# Patient Record
Sex: Male | Born: 1970
Health system: Southern US, Community
[De-identification: ages and names within clinical notes are randomized; demographics above are authoritative.]

## PROBLEM LIST (undated history)

## (undated) DIAGNOSIS — J301 Allergic rhinitis due to pollen: Secondary | ICD-10-CM

## (undated) DIAGNOSIS — K219 Gastro-esophageal reflux disease without esophagitis: Secondary | ICD-10-CM

## (undated) HISTORY — DX: Gastro-esophageal reflux disease without esophagitis: K21.9

## (undated) HISTORY — DX: Allergic rhinitis due to pollen: J30.1

## (undated) HISTORY — PX: NOSE SURGERY: SHX723

## (undated) HISTORY — PX: EAR CANALOPLASTY: SHX1481

---

## 2017-02-13 ENCOUNTER — Encounter: Payer: Self-pay | Admitting: Family Medicine

## 2017-02-13 ENCOUNTER — Ambulatory Visit (INDEPENDENT_AMBULATORY_CARE_PROVIDER_SITE_OTHER): Payer: 59 | Admitting: Family Medicine

## 2017-02-13 VITALS — BP 120/76 | HR 58 | Temp 98.6°F | Ht 71.0 in | Wt 204.4 lb

## 2017-02-13 DIAGNOSIS — E559 Vitamin D deficiency, unspecified: Secondary | ICD-10-CM | POA: Diagnosis not present

## 2017-02-13 DIAGNOSIS — Z0000 Encounter for general adult medical examination without abnormal findings: Secondary | ICD-10-CM | POA: Diagnosis not present

## 2017-02-13 DIAGNOSIS — R7989 Other specified abnormal findings of blood chemistry: Secondary | ICD-10-CM | POA: Diagnosis not present

## 2017-02-13 DIAGNOSIS — E785 Hyperlipidemia, unspecified: Secondary | ICD-10-CM

## 2017-02-13 DIAGNOSIS — R5383 Other fatigue: Secondary | ICD-10-CM | POA: Diagnosis not present

## 2017-02-13 DIAGNOSIS — J301 Allergic rhinitis due to pollen: Secondary | ICD-10-CM

## 2017-02-13 DIAGNOSIS — K219 Gastro-esophageal reflux disease without esophagitis: Secondary | ICD-10-CM

## 2017-02-13 DIAGNOSIS — R351 Nocturia: Secondary | ICD-10-CM | POA: Diagnosis not present

## 2017-02-13 HISTORY — DX: Gastro-esophageal reflux disease without esophagitis: K21.9

## 2017-02-13 HISTORY — DX: Allergic rhinitis due to pollen: J30.1

## 2017-02-13 LAB — COMPREHENSIVE METABOLIC PANEL
ALT: 21 U/L (ref 0–53)
AST: 23 U/L (ref 0–37)
Albumin: 4.3 g/dL (ref 3.5–5.2)
Alkaline Phosphatase: 31 U/L — ABNORMAL LOW (ref 39–117)
BUN: 11 mg/dL (ref 6–23)
CO2: 29 mEq/L (ref 19–32)
Calcium: 9.2 mg/dL (ref 8.4–10.5)
Chloride: 104 mEq/L (ref 96–112)
Creatinine, Ser: 1.15 mg/dL (ref 0.40–1.50)
GFR: 71.86 mL/min (ref >60.00)
Glucose, Bld: 87 mg/dL (ref 70–99)
Potassium: 3.9 mEq/L (ref 3.5–5.1)
Sodium: 141 mEq/L (ref 135–145)
Total Bilirubin: 0.8 mg/dL (ref 0.2–1.2)
Total Protein: 7.3 g/dL (ref 6.0–8.3)

## 2017-02-13 LAB — CBC WITH DIFFERENTIAL/PLATELET
Basophils Absolute: 0 10*3/uL (ref 0.0–0.1)
Basophils Relative: 0.5 % (ref 0.0–3.0)
Eosinophils Absolute: 0.2 10*3/uL (ref 0.0–0.7)
Eosinophils Relative: 5.5 % — ABNORMAL HIGH (ref 0.0–5.0)
HCT: 38.6 % — ABNORMAL LOW (ref 39.0–52.0)
Hemoglobin: 12.9 g/dL — ABNORMAL LOW (ref 13.0–17.0)
Lymphocytes Relative: 53.7 % — ABNORMAL HIGH (ref 12.0–46.0)
Lymphs Abs: 1.6 10*3/uL (ref 0.7–4.0)
MCHC: 33.4 g/dL (ref 30.0–36.0)
MCV: 85 fl (ref 78.0–100.0)
Monocytes Absolute: 0.3 10*3/uL (ref 0.1–1.0)
Monocytes Relative: 9 % (ref 3.0–12.0)
Neutro Abs: 0.9 10*3/uL — ABNORMAL LOW (ref 1.4–7.7)
Neutrophils Relative %: 31.3 % — ABNORMAL LOW (ref 43.0–77.0)
Platelets: 195 10*3/uL (ref 150.0–400.0)
RBC: 4.54 Mil/uL (ref 4.22–5.81)
RDW: 13 % (ref 11.5–15.5)
WBC: 3 10*3/uL — ABNORMAL LOW (ref 4.0–10.5)

## 2017-02-13 LAB — PSA: PSA: 0.36 ng/mL (ref 0.10–4.00)

## 2017-02-13 LAB — LIPID PANEL
Cholesterol: 122 mg/dL (ref 0–200)
HDL: 40.6 mg/dL (ref >39.00)
LDL Cholesterol: 72 mg/dL (ref 0–99)
NonHDL: 80.97
Total CHOL/HDL Ratio: 3
Triglycerides: 44 mg/dL (ref 0.0–149.0)
VLDL: 8.8 mg/dL (ref 0.0–40.0)

## 2017-02-13 LAB — TSH: TSH: 1.07 u[IU]/mL (ref 0.35–4.50)

## 2017-02-13 LAB — VITAMIN D 25 HYDROXY (VIT D DEFICIENCY, FRACTURES): VITD: 15.64 ng/mL — ABNORMAL LOW (ref 30.00–100.00)

## 2017-02-13 NOTE — Progress Notes (Signed)
Subjective:    Shawn Franco is a 47 y.o. male who presents today for his Complete Annual Exam.    Health Maintenance Due  Topic Date Due  . HIV Screening  03/14/1985   PMHx, SurgHx, SocialHx, Medications, and Allergies were reviewed in the Visit Navigator and updated as appropriate.   Past Medical History:  Diagnosis Date  . Gastroesophageal reflux disease without esophagitis 02/13/2017  . Seasonal allergic rhinitis due to pollen 02/13/2017   History reviewed. No pertinent surgical history. History reviewed. No pertinent family history.   Social History   Tobacco Use  . Smoking status: Never Smoker  . Smokeless tobacco: Never Used  Substance Use Topics  . Alcohol use: Not on file  . Drug use: Not on file   Review of Systems:   Pertinent items are noted in the HPI. Otherwise, ROS is negative.  Objective:   Vitals:   02/13/17 1405  BP: 120/76  Pulse: (!) 58  Temp: 98.6 F (37 C)  SpO2: 96%   Body mass index is 28.51 kg/m.  General Appearance:  Alert, cooperative, no distress, appears stated age  Head:  Normocephalic, without obvious abnormality, atraumatic  Eyes:  PERRL, conjunctiva/corneas clear, EOM's intact, fundi benign, both eyes       Ears:  Normal TM's and external ear canals, both ears  Nose: Nares normal, septum midline, mucosa normal, no drainage    or sinus tenderness  Throat: Lips, mucosa, and tongue normal; teeth and gums normal  Neck: Supple, symmetrical, trachea midline, no adenopathy; thyroid:  No enlargement/tenderness/nodules; no carotit bruit or JVD  Back:   Symmetric, no curvature, ROM normal, no CVA tenderness  Lungs:   Clear to auscultation bilaterally, respirations unlabored  Chest wall:  No tenderness or deformity  Heart:  Regular rate and rhythm, S1 and S2 normal, no murmur, rub   or gallop  Abdomen:   Soft, non-tender, bowel sounds active all four quadrants, no masses, no organomegaly  Extremities: Extremities normal, atraumatic, no  cyanosis or edema  Prostate: Not done.   Skin: Skin color, texture, turgor normal, no rashes or lesions  Lymph nodes: Cervical, supraclavicular, and axillary nodes normal  Neurologic: CNII-XII grossly intact. Normal strength, sensation and reflexes throughout   Assessment/Plan:   Shawn Franco was seen today for establish care.  Diagnoses and all orders for this visit:  Routine physical examination  Vitamin D deficiency -     VITAMIN D 25 Hydroxy (Vit-D Deficiency, Fractures)  Nocturia -     PSA  Fatigue, unspecified type -     Comprehensive metabolic panel -     CBC with Differential/Platelet -     TSH  Hyperlipidemia, unspecified hyperlipidemia type -     Lipid panel   Patient Counseling: [x]   Nutrition: Stressed importance of moderation in sodium/caffeine intake, saturated fat and cholesterol, caloric balance, sufficient intake of fresh fruits, vegetables, and fiber.  [x]   Stressed the importance of regular exercise.   []   Substance Abuse: Discussed cessation/primary prevention of tobacco, alcohol, or other drug use; driving or other dangerous activities under the influence; availability of treatment for abuse.   [x]   Injury prevention: Discussed safety belts, safety helmets, smoke detector, smoking near bedding or upholstery.   []   Sexuality: Discussed sexually transmitted diseases, partner selection, use of condoms, avoidance of unintended pregnancy and contraceptive alternatives.   [x]   Dental health: Discussed importance of regular tooth brushing, flossing, and dental visits.  [x]   Health maintenance and immunizations reviewed. Please refer  to Health maintenance section.   Shawn Deutscher, DO [name]

## 2017-02-15 DIAGNOSIS — E559 Vitamin D deficiency, unspecified: Secondary | ICD-10-CM | POA: Insufficient documentation

## 2017-02-15 MED ORDER — CHOLECALCIFEROL 1.25 MG (50000 UT) PO TABS: ORAL_TABLET | ORAL | 0 refills | Status: DC

## 2017-02-15 NOTE — Addendum Note (Signed)
Addended by: [name] R on: 02/15/2017 02:07 PM   Modules accepted: Orders

## 2017-04-06 ENCOUNTER — Telehealth: Payer: Self-pay | Admitting: Family Medicine

## 2017-04-06 NOTE — Telephone Encounter (Signed)
Called pt to discuss his symptoms for needing Valtrex. He states that he will break out in a cold sore and he takes this. He states it is a historical med. I told him that he might have to be seen in the office before he can get this. His last office visit was 02/13/17. His provider is  Hyman Hopes.  He states he takes 2 grams every day for Q12 hours when he is having the outbreak and he normally gets 30 tablets that will last him a year.  See request.  Pharmacy  [facility]  Please review.

## 2017-04-06 NOTE — Telephone Encounter (Signed)
Copied from [place] [id number]. Topic: Quick Communication - Rx Refill/Question >> Apr 06, 2017  3:14 PM Sandi Mariscal E, NT wrote: Medication: Valtrex 1000 mg Has the patient contacted their pharmacy? Yes (Agent: If no, request that the patient contact the pharmacy for the refill.) Preferred Pharmacy (with phone number or street name): [place], [place] - [address] [phone] (Phone) [phone] (Fax)     Agent: Please be advised that RX refills may take up to 3 business days. We ask that you follow-up with your pharmacy.

## 2017-04-06 NOTE — Telephone Encounter (Signed)
See note

## 2017-04-07 ENCOUNTER — Telehealth: Payer: 59 | Admitting: Family

## 2017-04-07 DIAGNOSIS — B009 Herpesviral infection, unspecified: Secondary | ICD-10-CM | POA: Diagnosis not present

## 2017-04-07 MED ORDER — VALACYCLOVIR HCL 1 G PO TABS: 2000.0000 mg | ORAL_TABLET | Freq: Two times a day (BID) | ORAL | 0 refills | Status: DC

## 2017-04-07 MED FILL — valACYclovir HCL 1 GM TABS: 1 | 7 days supply | Qty: 30 | Fill #0

## 2017-04-07 NOTE — Telephone Encounter (Signed)
Noted  

## 2017-04-07 NOTE — Telephone Encounter (Signed)
As this was not discussed at his visit, which was physical, he will need to be seen. Let him know that e-visit available to him.

## 2017-04-07 NOTE — Telephone Encounter (Signed)
Mychart message sent to patient.

## 2017-04-07 NOTE — Progress Notes (Signed)
Thank you for the details you included in the comment boxes. Those details are very helpful in determining the best course of treatment for you and help us to provide the best care.  We are sorry that you are not feeling well.  Here is how we plan to help!  Based on what you have shared with me it does look like you have a viral infection.    Most cold sores or fever blisters are small fluid filled blisters around the mouth caused by herpes simplex virus.  The most common strain of the virus causing cold sores is herpes simplex virus 1.  It can be spread by skin contact, sharing eating utensils, or even sharing towels.  Cold sores are contagious to other people until dry. (Approximately 5-7 days).  Wash your hands. You can spread the virus to your eyes through handling your contact lenses after touching the lesions.  Most people experience pain at the sight or tingling sensations in their lips that may begin before the ulcers erupt.  Herpes simplex is treatable but not curable.  It may lie dormant for a long time and then reappear due to stress or prolonged sun exposure.  Many patients have success in treating their cold sores with an over the counter topical called Abreva.  You may apply the cream up to 5 times daily (maximum 10 days) until healing occurs.  If you would like to use an oral antiviral medication to speed the healing of your cold sore, I have sent a prescription to your local pharmacy Valacyclovir 2 gm twice daily for 1 day    HOME CARE:   Wash your hands frequently.  Do not pick at or rub the sore.  Don't open the blisters.  Avoid kissing other people during this time.  Avoid sharing drinking glasses, eating utensils, or razors.  Do not handle contact lenses unless you have thoroughly washed your hands with soap and warm water!  Avoid oral sex during this time.  Herpes from sores on your mouth can spread to your partner's genital area.  Avoid contact with anyone who has  eczema or a weakened immune system.  Cold sores are often triggered by exposure to intense sunlight, use a lip balm containing a sunscreen (SPF 30 or higher).  GET HELP RIGHT AWAY IF:   Blisters look infected.  Blisters occur near or in the eye.  Symptoms last longer than 10 days.  Your symptoms become worse.  MAKE SURE YOU:   Understand these instructions.  Will watch your condition.  Will get help right away if you are not doing well or get worse.    Your e-visit answers were reviewed by a board certified advanced clinical practitioner to complete your personal care plan.  Depending upon the condition, your plan could have  Included both over the counter or prescription medications.    Please review your pharmacy choice.  Be sure that the pharmacy you have chosen is open so that you can pick up your prescription now.  If there is a problem you csn message your provider in MyChart to have the prescription routed to another pharmacy.    Your safety is important to us.  If you have drug allergies check our prescription carefully.  For the next 24 hours you can use MyChart to ask questions about today's visit, request a non-urgent call back, or ask for a work or school excuse from your e-visit provider.  You will get an email in the   the next two days asking about your experience.  I hope that your e-visit has been valuable and will speed your recovery.  

## 2017-04-07 NOTE — Telephone Encounter (Signed)
Pt called back and was advised of comments, pt is doing mychart visit

## 2017-04-07 NOTE — Telephone Encounter (Signed)
Pt said this needs to happen today bc he can not wait three business days. Please advise.

## 2017-04-07 NOTE — Telephone Encounter (Signed)
See Note

## 2017-04-07 NOTE — Telephone Encounter (Signed)
This was not addressed at appointment. Ok to give script?

## 2017-04-10 NOTE — Telephone Encounter (Signed)
Must be seen or e- visit

## 2018-06-10 ENCOUNTER — Encounter: Payer: Self-pay | Admitting: Family Medicine

## 2018-06-10 ENCOUNTER — Other Ambulatory Visit: Payer: Self-pay

## 2018-06-10 DIAGNOSIS — B009 Herpesviral infection, unspecified: Secondary | ICD-10-CM

## 2018-06-13 MED ORDER — VALACYCLOVIR HCL 1 G PO TABS: 2000.0000 mg | ORAL_TABLET | Freq: Two times a day (BID) | ORAL | 1 refills | Status: DC

## 2018-06-13 MED ORDER — METHOCARBAMOL 750 MG PO TABS: 750.0000 mg | ORAL_TABLET | Freq: Three times a day (TID) | ORAL | 1 refills | Status: DC | PRN

## 2018-06-13 MED ORDER — METHOCARBAMOL 750 MG PO TABS: 750.0000 mg | ORAL_TABLET | Freq: Every day | ORAL | 1 refills | Status: DC | PRN

## 2018-06-13 NOTE — Addendum Note (Signed)
Addended by: Jasper Loser on: 06/13/2018 11:43 AM   Modules accepted: Orders

## 2018-08-15 DIAGNOSIS — R5383 Other fatigue: Secondary | ICD-10-CM | POA: Diagnosis not present

## 2018-08-15 DIAGNOSIS — Z1159 Encounter for screening for other viral diseases: Secondary | ICD-10-CM | POA: Diagnosis not present

## 2018-08-15 DIAGNOSIS — Z0000 Encounter for general adult medical examination without abnormal findings: Secondary | ICD-10-CM | POA: Diagnosis not present

## 2019-01-18 DIAGNOSIS — Z860100 Personal history of colon polyps, unspecified: Secondary | ICD-10-CM

## 2019-01-18 HISTORY — DX: Personal history of colon polyps, unspecified: Z86.0100

## 2019-01-18 HISTORY — DX: Personal history of colonic polyps: Z86.010

## 2019-02-22 ENCOUNTER — Encounter: Payer: Self-pay | Admitting: Gastroenterology

## 2019-03-25 ENCOUNTER — Encounter: Payer: Self-pay | Admitting: Physician Assistant

## 2019-03-25 ENCOUNTER — Other Ambulatory Visit: Payer: Self-pay

## 2019-03-25 ENCOUNTER — Ambulatory Visit (INDEPENDENT_AMBULATORY_CARE_PROVIDER_SITE_OTHER): Payer: 59 | Admitting: Physician Assistant

## 2019-03-25 ENCOUNTER — Other Ambulatory Visit: Payer: Self-pay | Admitting: Physician Assistant

## 2019-03-25 VITALS — BP 110/72 | HR 61 | Ht 71.0 in | Wt 213.5 lb

## 2019-03-25 DIAGNOSIS — R351 Nocturia: Secondary | ICD-10-CM | POA: Diagnosis not present

## 2019-03-25 DIAGNOSIS — B009 Herpesviral infection, unspecified: Secondary | ICD-10-CM | POA: Diagnosis not present

## 2019-03-25 DIAGNOSIS — Z0000 Encounter for general adult medical examination without abnormal findings: Secondary | ICD-10-CM | POA: Diagnosis not present

## 2019-03-25 DIAGNOSIS — K219 Gastro-esophageal reflux disease without esophagitis: Secondary | ICD-10-CM

## 2019-03-25 DIAGNOSIS — Z125 Encounter for screening for malignant neoplasm of prostate: Secondary | ICD-10-CM

## 2019-03-25 DIAGNOSIS — R635 Abnormal weight gain: Secondary | ICD-10-CM | POA: Diagnosis not present

## 2019-03-25 DIAGNOSIS — E559 Vitamin D deficiency, unspecified: Secondary | ICD-10-CM

## 2019-03-25 DIAGNOSIS — E663 Overweight: Secondary | ICD-10-CM | POA: Diagnosis not present

## 2019-03-25 LAB — PSA: PSA: 0.36 ng/mL (ref 0.10–4.00)

## 2019-03-25 LAB — COMPREHENSIVE METABOLIC PANEL
ALT: 22 U/L (ref 0–53)
AST: 25 U/L (ref 0–37)
Albumin: 4.3 g/dL (ref 3.5–5.2)
Alkaline Phosphatase: 34 U/L — ABNORMAL LOW (ref 39–117)
BUN: 8 mg/dL (ref 6–23)
CO2: 30 mEq/L (ref 19–32)
Calcium: 9.6 mg/dL (ref 8.4–10.5)
Chloride: 104 mEq/L (ref 96–112)
Creatinine, Ser: 1.13 mg/dL (ref 0.40–1.50)
GFR: 68.41 mL/min (ref >60.00)
Glucose, Bld: 93 mg/dL (ref 70–99)
Potassium: 4.2 mEq/L (ref 3.5–5.1)
Sodium: 140 mEq/L (ref 135–145)
Total Bilirubin: 0.6 mg/dL (ref 0.2–1.2)
Total Protein: 7.5 g/dL (ref 6.0–8.3)

## 2019-03-25 LAB — CBC WITH DIFFERENTIAL/PLATELET
Basophils Absolute: 0 10*3/uL (ref 0.0–0.1)
Basophils Relative: 0.4 % (ref 0.0–3.0)
Eosinophils Absolute: 0.4 10*3/uL (ref 0.0–0.7)
Eosinophils Relative: 9.4 % — ABNORMAL HIGH (ref 0.0–5.0)
HCT: 38.7 % — ABNORMAL LOW (ref 39.0–52.0)
Hemoglobin: 13.1 g/dL (ref 13.0–17.0)
Lymphocytes Relative: 45.8 % (ref 12.0–46.0)
Lymphs Abs: 1.7 10*3/uL (ref 0.7–4.0)
MCHC: 33.7 g/dL (ref 30.0–36.0)
MCV: 85.1 fl (ref 78.0–100.0)
Monocytes Absolute: 0.4 10*3/uL (ref 0.1–1.0)
Monocytes Relative: 9.4 % (ref 3.0–12.0)
Neutro Abs: 1.3 10*3/uL — ABNORMAL LOW (ref 1.4–7.7)
Neutrophils Relative %: 35 % — ABNORMAL LOW (ref 43.0–77.0)
Platelets: 192 10*3/uL (ref 150.0–400.0)
RBC: 4.54 Mil/uL (ref 4.22–5.81)
RDW: 12.8 % (ref 11.5–15.5)
WBC: 3.8 10*3/uL — ABNORMAL LOW (ref 4.0–10.5)

## 2019-03-25 LAB — VITAMIN D 25 HYDROXY (VIT D DEFICIENCY, FRACTURES): VITD: 50.52 ng/mL (ref 30.00–100.00)

## 2019-03-25 LAB — TSH: TSH: 1.77 u[IU]/mL (ref 0.35–4.50)

## 2019-03-25 MED ORDER — TADALAFIL 20 MG PO TABS: 20.0000 mg | ORAL_TABLET | Freq: Every day | ORAL | 0 refills | Status: DC | PRN

## 2019-03-25 NOTE — Progress Notes (Deleted)
Shawn Franco is a 49 y.o. male is here to discuss: Transfer of care  I acted as a Education administrator for [employer], PA-C Anselmo Pickler, LPN  History of Present Illness:   No chief complaint on file.   HPI  Pt is here today for transfer of care from Dr. Juleen Franco.    Health Maintenance Due  Topic Date Due  . HIV Screening  03/14/1985  . COLONOSCOPY  03/13/2018  . INFLUENZA VACCINE  08/18/2018    Past Medical History:  Diagnosis Date  . Gastroesophageal reflux disease without esophagitis 02/13/2017  . Seasonal allergic rhinitis due to pollen 02/13/2017     Social History   Socioeconomic History  . Marital status: Not on file    Spouse name: Not on file  . Number of children: Not on file  . Years of education: Not on file  . Highest education level: Not on file  Occupational History  . Not on file  Tobacco Use  . Smoking status: Never Smoker  . Smokeless tobacco: Never Used  Substance and Sexual Activity  . Alcohol use: Not on file  . Drug use: Not on file  . Sexual activity: Not on file  Other Topics Concern  . Not on file  Social History Narrative  . Not on file   Social Determinants of Health   Financial Resource Strain:   . Difficulty of Paying Living Expenses: Not on file  Food Insecurity:   . Worried About Charity fundraiser in the Last Year: Not on file  . Ran Out of Food in the Last Year: Not on file  Transportation Needs:   . Lack of Transportation (Medical): Not on file  . Lack of Transportation (Non-Medical): Not on file  Physical Activity:   . Days of Exercise per Week: Not on file  . Minutes of Exercise per Session: Not on file  Stress:   . Feeling of Stress : Not on file  Social Connections:   . Frequency of Communication with Friends and Family: Not on file  . Frequency of Social Gatherings with Friends and Family: Not on file  . Attends Religious Services: Not on file  . Active Member of Clubs or Organizations: Not on file  .  Attends Archivist Meetings: Not on file  . Marital Status: Not on file  Intimate Partner Violence:   . Fear of Current or Ex-Partner: Not on file  . Emotionally Abused: Not on file  . Physically Abused: Not on file  . Sexually Abused: Not on file    No past surgical history on file.  No family history on file.  PMHx, SurgHx, SocialHx, FamHx, Medications, and Allergies were reviewed in the Visit Navigator and updated as appropriate.   Patient Active Problem List   Diagnosis Date Noted  . Vitamin D deficiency 02/15/2017  . Seasonal allergic rhinitis due to pollen 02/13/2017  . Gastroesophageal reflux disease without esophagitis 02/13/2017    Social History   Tobacco Use  . Smoking status: Never Smoker  . Smokeless tobacco: Never Used  Substance Use Topics  . Alcohol use: Not on file  . Drug use: Not on file    Current Medications and Allergies:    Current Outpatient Medications:  .  cetirizine (ZYRTEC) 10 MG tablet, Take 10 mg by mouth., Disp: , Rfl:  .  Cholecalciferol 50000 units TABS, 50,000 units PO qwk for 12 weeks., Disp: 12 tablet, Rfl: 0 .  fluticasone (FLONASE) 50 MCG/ACT nasal  spray, 1 spray by Each Nare route daily., Disp: , Rfl:  .  Ibuprofen 200 MG CAPS, Take by mouth., Disp: , Rfl:  .  methocarbamol (ROBAXIN) 750 MG tablet, Take 1 tablet (750 mg total) by mouth every 8 (eight) hours as needed for muscle spasms., Disp: 90 tablet, Rfl: 1 .  ranitidine (ZANTAC) 75 MG tablet, Take 75 mg by mouth., Disp: , Rfl:  .  valACYclovir (VALTREX) 1000 MG tablet, Take 2 tablets (2,000 mg total) by mouth 2 (two) times daily. x1 day and may repeat with future outbreaks, Disp: 30 tablet, Rfl: 1  Allergies  Allergen Reactions  . Procaine Anaphylaxis  . Tetanus Toxoids Anaphylaxis    Tetanus Serum    Review of Systems   ROS  Vitals:  There were no vitals filed for this visit.   There is no height or weight on file to calculate BMI.   Physical Exam:     Physical Exam   Assessment and Plan:    There are no diagnoses linked to this encounter.  . Reviewed expectations re: course of current medical issues. . Discussed self-management of symptoms. . Outlined signs and symptoms indicating need for more acute intervention. . Patient verbalized understanding and all questions were answered. . See orders for this visit as documented in the electronic medical record. . Patient received an After Visit Summary.  ***  Inda Coke, PA-C [place] [place] 03/25/2019  Follow-up: No follow-ups on file.

## 2019-03-25 NOTE — Patient Instructions (Signed)

## 2019-03-25 NOTE — Progress Notes (Signed)
I acted as a Education administrator for [employer], [employer] I acted as a Education administrator for [employer], PA-C Anselmo Pickler, LPN  Subjective:    Shawn Franco is a 49 y.o. male and is here for a comprehensive physical exam.  HPI  Pt is here for transfer of care today from Dr. Juleen China and physical.  Health Maintenance Due  Topic Date Due  . HIV Screening  03/14/1985  . COLONOSCOPY  03/13/2018    Acute Concerns: Nocturia -- about two times per week has issues with waking up in the middle of the night to urinate which causes him to lose about an hour (or more) of sleep due to difficulty falling asleep after this occurs.  GERD -- has had GERD since he was a teenager; taking emeprazole prn; GERD; seeing Dr. Dorena Bodo at end of March for screening colonoscopy; since a teenager has had GERD issues; no red flag symptoms  Chronic Issues: Vit D deficiency -- doesn't take a regular supplement; would like Vit D levels rechecked HSV -- gets outbreaks with stress; always on the R side of his mouth, happens 3-4 times per year  Health Maintenance: Immunizations -- UTD Colonoscopy -- scheduled March 31 [facility] -- done 2019 normal Diet -- eating less Caffeine intake -- 3 cups a day during weekly schedule; 1 cup a day during Sleep habits -- waking up to urinate about 2 x a week Exercise -- no scheduled exercise; 5k Weight -- Weight: 213 lb 8 oz (96.8 kg)  Weight history Wt Readings from Last 10 Encounters:  03/25/19 213 lb 8 oz (96.8 kg)  02/13/17 204 lb 6.4 oz (92.7 kg)  Mood -- no concerns Tobacco use -- none Alcohol use --- none  Depression screen PHQ 2/9 03/25/2019  Decreased Interest 0  Down, Depressed, Hopeless 0  PHQ - 2 Score 0     Other providers/specialists: Patient Care Team: Inda Coke, [place] as PCP - General (Physician Assistant)   PMHx, SurgHx, SocialHx, Medications, and Allergies were reviewed in the Visit Navigator and updated as appropriate.   Past  Medical History:  Diagnosis Date  . Gastroesophageal reflux disease without esophagitis 02/13/2017  . Seasonal allergic rhinitis due to pollen 02/13/2017    History reviewed. No pertinent surgical history.   Family History  Problem Relation Age of Onset  . Diabetes Mother   . Hypertension Mother     Social History   Tobacco Use  . Smoking status: Never Smoker  . Smokeless tobacco: Never Used  Substance Use Topics  . Alcohol use: Not on file  . Drug use: Not on file    Review of Systems:   Review of Systems  Constitutional: Negative for chills, fever, malaise/fatigue and weight loss.  HENT: Negative for hearing loss, sinus pain and sore throat.   Respiratory: Negative for cough and hemoptysis.   Cardiovascular: Negative for chest pain, palpitations, leg swelling and PND.  Gastrointestinal: Negative for abdominal pain, constipation, diarrhea, heartburn, nausea and vomiting.  Genitourinary: Negative for dysuria, frequency and urgency.  Musculoskeletal: Negative for back pain, myalgias and neck pain.  Skin: Negative for itching and rash.  Neurological: Negative for dizziness, tingling, seizures and headaches.  Endo/Heme/Allergies: Negative for polydipsia.  Psychiatric/Behavioral: Negative for depression. The patient is not nervous/anxious.     Objective:   Vitals:   03/25/19 0841  BP: 110/72  Pulse: 61  SpO2: 98%   Body mass index is 29.78 kg/m.  General Appearance:  Alert, cooperative, no  distress, appears stated age  Head:  Normocephalic, without obvious abnormality, atraumatic  Eyes:  PERRL, conjunctiva/corneas clear, EOM's intact, fundi benign, both eyes       Ears:  Normal TM's and external ear canals, both ears  Nose: Nares normal, septum midline, mucosa normal, no drainage    or sinus tenderness  Throat: Lips, mucosa, and tongue normal; teeth and gums normal  Neck: Supple, symmetrical, trachea midline, no adenopathy; thyroid:  No  enlargement/tenderness/nodules; no carotit bruit or JVD  Back:   Symmetric, no curvature, ROM normal, no CVA tenderness  Lungs:   Clear to auscultation bilaterally, respirations unlabored  Chest wall:  No tenderness or deformity  Heart:  Regular rate and rhythm, S1 and S2 normal, no murmur, rub   or gallop  Abdomen:   Soft, non-tender, bowel sounds active all four quadrants, no masses, no organomegaly  Extremities: Extremities normal, atraumatic, no cyanosis or edema  Prostate: Not done.   Skin: Skin color, texture, turgor normal, no rashes or lesions  Lymph nodes: Cervical, supraclavicular, and axillary nodes normal  Neurologic: CNII-XII grossly intact. Normal strength, sensation and reflexes throughout    Assessment/Plan:   Kody was seen today for transfer of care and annual exam.  Diagnoses and all orders for this visit:  Routine physical examination Today patient counseled on age appropriate routine health concerns for screening and prevention, each reviewed and up to date or declined. Immunizations reviewed and up to date or declined. Labs ordered and reviewed. Risk factors for depression reviewed and negative. Hearing function and visual acuity are intact. ADLs screened and addressed as needed. Functional ability and level of safety reviewed and appropriate. Education, counseling and referrals performed based on assessed risks today. Patient provided with a copy of personalized plan for preventive services.  Weight gain; Overweight Suspect due to pandemic causing significantly decreased activity. Encouraged exercise. Will check TSH to r/o organic cause. -     TSH  Vitamin D deficiency -     VITAMIN D 25 Hydroxy (Vit-D Deficiency, Fractures)  Gastroesophageal reflux disease without esophagitis Referral to GI per patient request. -     CBC with Differential/Platelet -     Comprehensive metabolic panel -     Ambulatory referral to Gastroenterology  Prostate cancer screening  -     PSA  Nocturia He would like to trial prn cialis for this. Follow-up if symptoms worsen or persist despite treatment. -     PSA  HSV-1 (herpes simplex virus 1) infection Well controlled.   Other orders -     tadalafil (CIALIS) 20 MG tablet; Take 1 tablet (20 mg total) by mouth daily as needed (BPH).   Well Adult Exam: Labs ordered: Yes. Patient counseling was done. See below for items discussed. Discussed the patient's BMI.  The BMI is not in the acceptable range; BMI management plan is completed Follow up as needed for acute illness.  Patient Counseling: [x]   Nutrition: Stressed importance of moderation in sodium/caffeine intake, saturated fat and cholesterol, caloric balance, sufficient intake of fresh fruits, vegetables, and fiber.  [x]   Stressed the importance of regular exercise.   []   Substance Abuse: Discussed cessation/primary prevention of tobacco, alcohol, or other drug use; driving or other dangerous activities under the influence; availability of treatment for abuse.   [x]   Injury prevention: Discussed safety belts, safety helmets, smoke detector, smoking near bedding or upholstery.   []   Sexuality: Discussed sexually transmitted diseases, partner selection, use of condoms, avoidance of unintended pregnancy  and contraceptive alternatives.   [x]   Dental health: Discussed importance of regular tooth brushing, flossing, and dental visits.  [x]   Health maintenance and immunizations reviewed. Please refer to Health maintenance section.    CMA or LPN served as scribe during this visit. History, Physical, and Plan performed by medical provider. The above documentation has been reviewed and is accurate and complete.   Inda Coke, PA-C [place]

## 2019-03-26 ENCOUNTER — Encounter: Payer: Self-pay | Admitting: Gastroenterology

## 2019-03-26 ENCOUNTER — Ambulatory Visit (INDEPENDENT_AMBULATORY_CARE_PROVIDER_SITE_OTHER): Payer: 59 | Admitting: Gastroenterology

## 2019-03-26 VITALS — BP 102/70 | HR 76 | Temp 97.6°F | Ht 71.0 in | Wt 212.0 lb

## 2019-03-26 DIAGNOSIS — K219 Gastro-esophageal reflux disease without esophagitis: Secondary | ICD-10-CM

## 2019-03-26 DIAGNOSIS — Z1211 Encounter for screening for malignant neoplasm of colon: Secondary | ICD-10-CM | POA: Insufficient documentation

## 2019-03-26 MED ORDER — NA SULFATE-K SULFATE-MG SULF 17.5-3.13-1.6 GM/177ML PO SOLN: 1.0000 | Freq: Once | ORAL | 0 refills | Status: AC

## 2019-03-26 NOTE — Patient Instructions (Signed)
If you are age 51 or older, your body mass index should be between 23-30. Your Body mass index is 29.57 kg/m. If this is out of the aforementioned range listed, please consider follow up with your Primary Care Provider.  If you are age 68 or younger, your body mass index should be between 19-25. Your Body mass index is 29.57 kg/m. If this is out of the aformentioned range listed, please consider follow up with your Primary Care Provider.   You have been scheduled for an endoscopy and colonoscopy. Please follow the written instructions given to you at your visit today. Please pick up your prep supplies at the pharmacy within the next 1-3 days. If you use inhalers (even only as needed), please bring them with you on the day of your procedure.

## 2019-03-26 NOTE — Progress Notes (Signed)
03/26/2019 [facility] [id number] November 26, 1970   HISTORY OF PRESENT ILLNESS: This is a 49 year old male who is new to our office.  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  He is here today to discuss having an endoscopy.  He was going to be scheduled for screening colonoscopy as he never had one in the past, but then he wanted to discuss having endoscopy as well due to longstanding issues with acid reflux.  He has had issues with acid reflux on and off for at least the past 30 years.  He really only uses omeprazole as needed at this time.  Says he really only takes it if he knows that he is going to eat something that will seem to bother his stomach and cause acid reflux issues.  Otherwise he has no complaints.  Referred here by his PCP, Shawn Coke, PA-C.  Past Medical History:  Diagnosis Date  . Gastroesophageal reflux disease without esophagitis 02/13/2017  . Seasonal allergic rhinitis due to pollen 02/13/2017   No past surgical history on file.  reports that he has never smoked. He has never used smokeless tobacco. He reports that he does not drink alcohol or use drugs. family history includes Diabetes in his mother; Hypertension in his mother. Allergies  Allergen Reactions  . Procaine Anaphylaxis  . Tetanus Toxoids Anaphylaxis    Tetanus Serum      Outpatient Encounter Medications as of 03/26/2019  Medication Sig  . cetirizine (ZYRTEC) 10 MG tablet Take 10 mg by mouth.  . fluticasone (FLONASE) 50 MCG/ACT nasal spray 1 spray by Each Nare route daily.  . Ibuprofen 200 MG CAPS Take by mouth.  . methocarbamol (ROBAXIN) 750 MG tablet Take 1 tablet (750 mg total) by mouth every 8 (eight) hours as needed for muscle spasms.  . Omeprazole 20 MG TBEC Take by mouth as needed.  . ranitidine (ZANTAC) 75 MG tablet Take 75 mg by mouth.  . tadalafil (CIALIS) 20 MG tablet Take 1 tablet (20 mg total) by mouth daily as needed (BPH).  .  valACYclovir (VALTREX) 1000 MG tablet Take 2 tablets (2,000 mg total) by mouth 2 (two) times daily. x1 day and may repeat with future outbreaks   No facility-administered encounter medications on file as of 03/26/2019.    REVIEW OF SYSTEMS  : All other systems reviewed and negative except where noted in the History of Present Illness.   PHYSICAL EXAM: BP 102/70   Pulse 76   Temp 97.6 F (36.4 C)   Ht 5\' 11"  (1.803 m)   Wt 212 lb (96.2 kg)   BMI 29.57 kg/m  General: Well developed AA male in no acute distress Head: Normocephalic and atraumatic Eyes:  Sclerae anicteric, conjunctiva pink. Ears: Normal auditory acuity Lungs: Clear throughout to auscultation; no increased WOB. Heart: Regular rate and rhythm; no M/R/G. Abdomen: Soft, non-distended.  BS present.  Non-tender. Rectal:  Will be done at the time of colonoscopy. Musculoskeletal: Symmetrical with no gross deformities  Skin: No lesions on visible extremities Extremities: No edema  Neurological: Alert oriented x 4, grossly non-focal Psychological:  Alert and cooperative. Normal mood and affect  ASSESSMENT AND PLAN: *CRC screening:  Never had colonoscopy in the past.  No complaints.  Will schedule with Dr. Rush Landmark. *GERD:  Long-standing GERD.  Just uses omeprazole prn.  Has never had an EGD in the past and just wants to take a look to be sure that his long-standing  GERD has not caused any issues.  Will plan for EGD as well.  **The risks, benefits, and alternatives to EGD and colonoscopy were discussed with the patient and he consents to proceed.   CC:  [facility], [place]

## 2019-03-27 NOTE — Progress Notes (Signed)
Attending Physician's Attestation   I have reviewed the chart.   I agree with the Advanced Practitioner's note, impression, and recommendations with any updates as below.  Colonoscopy for screening and EGD diagnostic to rule out Esophagitis/Barrett's is next step.  Justice Britain, MD [facility] Advanced [facility]oscopy Office # [phone]

## 2019-04-17 ENCOUNTER — Encounter: Payer: 59 | Admitting: Gastroenterology

## 2019-04-18 DIAGNOSIS — A048 Other specified bacterial intestinal infections: Secondary | ICD-10-CM

## 2019-04-18 DIAGNOSIS — K3189 Other diseases of stomach and duodenum: Secondary | ICD-10-CM

## 2019-04-18 HISTORY — DX: Other specified bacterial intestinal infections: A04.8

## 2019-04-18 HISTORY — DX: Other diseases of stomach and duodenum: K31.89

## 2019-05-16 ENCOUNTER — Encounter: Payer: Self-pay | Admitting: Gastroenterology

## 2019-05-16 ENCOUNTER — Ambulatory Visit (AMBULATORY_SURGERY_CENTER): Payer: 59 | Admitting: Gastroenterology

## 2019-05-16 ENCOUNTER — Other Ambulatory Visit: Payer: Self-pay

## 2019-05-16 VITALS — BP 126/75 | HR 64 | Temp 97.3°F | Resp 16 | Ht 71.0 in | Wt 212.0 lb

## 2019-05-16 DIAGNOSIS — Z1211 Encounter for screening for malignant neoplasm of colon: Secondary | ICD-10-CM

## 2019-05-16 DIAGNOSIS — D123 Benign neoplasm of transverse colon: Secondary | ICD-10-CM | POA: Diagnosis not present

## 2019-05-16 DIAGNOSIS — D122 Benign neoplasm of ascending colon: Secondary | ICD-10-CM

## 2019-05-16 DIAGNOSIS — B9681 Helicobacter pylori [H. pylori] as the cause of diseases classified elsewhere: Secondary | ICD-10-CM

## 2019-05-16 DIAGNOSIS — K2970 Gastritis, unspecified, without bleeding: Secondary | ICD-10-CM | POA: Diagnosis not present

## 2019-05-16 DIAGNOSIS — K2950 Unspecified chronic gastritis without bleeding: Secondary | ICD-10-CM | POA: Diagnosis not present

## 2019-05-16 DIAGNOSIS — K3189 Other diseases of stomach and duodenum: Secondary | ICD-10-CM

## 2019-05-16 DIAGNOSIS — K219 Gastro-esophageal reflux disease without esophagitis: Secondary | ICD-10-CM | POA: Diagnosis not present

## 2019-05-16 MED ORDER — SODIUM CHLORIDE 0.9 % IV SOLN: 500.0000 mL | INTRAVENOUS | Status: DC

## 2019-05-16 NOTE — Op Note (Signed)
[facility] Patient Name: [facility] Procedure Date: 05/16/2019 11:06 AM MRN: [id number] Endoscopist: Justice Britain , MD Age: 48 Referring MD:  Date of Birth: Oct 14, 1970 Gender: Male Account #: [id number] Procedure:                Colonoscopy Indications:              Screening for colorectal malignant neoplasm, This                            is the patient's first colonoscopy Medicines:                Monitored Anesthesia Care Procedure:                Pre-Anesthesia Assessment:                           - Prior to the procedure, a History and Physical                            was performed, and patient medications and                            allergies were reviewed. The patient's tolerance of                            previous anesthesia was also reviewed. The risks                            and benefits of the procedure and the sedation                            options and risks were discussed with the patient.                            All questions were answered, and informed consent                            was obtained. Prior Anticoagulants: The patient has                            taken no previous anticoagulant or antiplatelet                            agents. ASA Grade Assessment: II - A patient with                            mild systemic disease. After reviewing the risks                            and benefits, the patient was deemed in                            satisfactory condition to undergo the procedure.  After obtaining informed consent, the colonoscope                            was passed under direct vision. Throughout the                            procedure, the patient's blood pressure, pulse, and                            oxygen saturations were monitored continuously. The                            Colonoscope was introduced through the anus and                            advanced to the 5 cm  into the ileum. The                            colonoscopy was performed without difficulty. The                            patient tolerated the procedure. The quality of the                            bowel preparation was adequate. The terminal ileum,                            ileocecal valve, appendiceal orifice, and rectum                            were photographed. Scope In: 11:23:17 AM Scope Out: 11:41:07 AM Scope Withdrawal Time: 0 hours 15 minutes 5 seconds  Total Procedure Duration: 0 hours 17 minutes 50 seconds  Findings:                 The digital rectal exam findings include                            hemorrhoids. Pertinent negatives include no                            palpable rectal lesions.                           The terminal ileum and ileocecal valve appeared                            normal.                           A large amount of liquid semi-liquid stool was                            found in the entire colon, interfering with  visualization. Lavage of the area was performed                            using copious amounts, resulting in clearance with                            adequate visualization.                           Two sessile polyps were found in the transverse                            colon (1) and ascending colon (1). The polyps were                            4 to 5 mm in size. These polyps were removed with a                            cold snare. Resection and retrieval were complete.                           Normal mucosa was found in the entire colon                            otherwise.                           Non-bleeding non-thrombosed internal hemorrhoids                            were found during retroflexion, during perianal                            exam and during digital exam. The hemorrhoids were                            Grade II (internal hemorrhoids that prolapse but                             reduce spontaneously). Complications:            No immediate complications. Estimated Blood Loss:     Estimated blood loss was minimal. Impression:               - Hemorrhoids found on digital rectal exam.                           - The examined portion of the ileum was normal.                           - Stool in the entire examined colon. Lavaged                            copiously with adequate visualization.                           -  Two 4 to 5 mm polyps in the transverse colon and                            in the ascending colon, removed with a cold snare.                            Resected and retrieved.                           - Normal mucosa in the entire examined colon.                           - Non-bleeding non-thrombosed internal hemorrhoids. Recommendation:           - The patient will be observed post-procedure,                            until all discharge criteria are met.                           - Discharge patient to home.                           - Patient has a contact number available for                            emergencies. The signs and symptoms of potential                            delayed complications were discussed with the                            patient. Return to normal activities tomorrow.                            Written discharge instructions were provided to the                            patient.                           - High fiber diet.                           - Use fiber, for example Citrucel, Fibercon, Konsyl                            or Metamucil.                           - Continue present medications.                           - Await pathology results.                           -  Repeat colonoscopy in 5 v 7 years for                            surveillance based on final pathology.                           - The findings and recommendations were discussed                            with the patient. Justice Britain, MD 05/16/2019 11:53:24 AM

## 2019-05-16 NOTE — Progress Notes (Signed)
Called to room to assist during endoscopic procedure.  Patient ID and intended procedure confirmed with present staff. Received instructions for my participation in the procedure from the performing physician.  

## 2019-05-16 NOTE — Patient Instructions (Signed)
YOU HAD AN ENDOSCOPIC PROCEDURE TODAY AT [facility]:   Refer to the procedure report that was given to you for any specific questions about what was found during the examination.  If the procedure report does not answer your questions, please call your gastroenterologist to clarify.  If you requested that your care partner not be given the details of your procedure findings, then the procedure report has been included in a sealed envelope for you to review at your convenience later.  **Handouts given on Gastritis, polyps, High fiber diet, and hemorrhoids**   YOU SHOULD EXPECT: Some feelings of bloating in the abdomen. Passage of more gas than usual.  Walking can help get rid of the air that was put into your GI tract during the procedure and reduce the bloating. If you had a lower endoscopy (such as a colonoscopy or flexible sigmoidoscopy) you may notice spotting of blood in your stool or on the toilet paper. If you underwent a bowel prep for your procedure, you may not have a normal bowel movement for a few days.  Please Note:  You might notice some irritation and congestion in your nose or some drainage.  This is from the oxygen used during your procedure.  There is no need for concern and it should clear up in a day or so.  SYMPTOMS TO REPORT IMMEDIATELY:   Following lower endoscopy (colonoscopy or flexible sigmoidoscopy):  Excessive amounts of blood in the stool  Significant tenderness or worsening of abdominal pains  Swelling of the abdomen that is new, acute  Fever of 100F or higher   Following upper endoscopy (EGD)  Vomiting of blood or coffee ground material  New chest pain or pain under the shoulder blades  Painful or persistently difficult swallowing  New shortness of breath  Fever of 100F or higher  Black, tarry-looking stools  For urgent or emergent issues, a gastroenterologist can be reached at any hour by calling [phone]. Do not use MyChart  messaging for urgent concerns.    DIET:  We do recommend a small meal at first, but then you may proceed to your regular diet.  Drink plenty of fluids but you should avoid alcoholic beverages for 24 hours.  ACTIVITY:  You should plan to take it easy for the rest of today and you should NOT DRIVE or use heavy machinery until tomorrow (because of the sedation medicines used during the test).    FOLLOW UP: Our staff will call the number listed on your records 48-72 hours following your procedure to check on you and address any questions or concerns that you may have regarding the information given to you following your procedure. If we do not reach you, we will leave a message.  We will attempt to reach you two times.  During this call, we will ask if you have developed any symptoms of COVID 19. If you develop any symptoms (ie: fever, flu-like symptoms, shortness of breath, cough etc.) before then, please call [phone].  If you test positive for Covid 19 in the 2 weeks post procedure, please call and report this information to [place].    If any biopsies were taken you will be contacted by phone or by letter within the next 1-3 weeks.  Please call us at [phone] if you have not heard about the biopsies in 3 weeks.    SIGNATURES/CONFIDENTIALITY: You and/or your care partner have signed paperwork which will be entered into your electronic medical record.  These signatures attest to the fact that that the information above on your After Visit Summary has been reviewed and is understood.  Full responsibility of the confidentiality of this discharge information lies with you and/or your care-partner.

## 2019-05-16 NOTE — Progress Notes (Signed)
Temp JB V/s CW  

## 2019-05-16 NOTE — Progress Notes (Signed)
A/ox3, pleased with MAC, report to RN 

## 2019-05-16 NOTE — Op Note (Signed)
[facility] Patient Name: Nouri Brusky Procedure Date: 05/16/2019 11:07 AM MRN: [id number] Endoscopist: Justice Britain , MD Age: 49 Referring MD:  Date of Birth: 10/15/1970 Gender: Male Account #: [id number] Procedure:                Upper GI endoscopy Indications:              Heartburn, Gastro-esophageal reflux disease,                            Follow-up of gastro-esophageal reflux disease Medicines:                Monitored Anesthesia Care Procedure:                Pre-Anesthesia Assessment:                           - Prior to the procedure, a History and Physical                            was performed, and patient medications and                            allergies were reviewed. The patient's tolerance of                            previous anesthesia was also reviewed. The risks                            and benefits of the procedure and the sedation                            options and risks were discussed with the patient.                            All questions were answered, and informed consent                            was obtained. Prior Anticoagulants: The patient has                            taken no previous anticoagulant or antiplatelet                            agents. ASA Grade Assessment: II - A patient with                            mild systemic disease. After reviewing the risks                            and benefits, the patient was deemed in                            satisfactory condition to undergo the procedure.  After obtaining informed consent, the endoscope was                            passed under direct vision. Throughout the                            procedure, the patient's blood pressure, pulse, and                            oxygen saturations were monitored continuously. The                            Endoscope was introduced through the mouth, and                            advanced to  the second part of duodenum. The upper                            GI endoscopy was accomplished without difficulty.                            The patient tolerated the procedure. Scope In: Scope Out: Findings:                 No gross lesions were noted in the entire                            esophagus. Biopsies were taken with a cold forceps                            for histology.                           The Z-line was regular and was found 40 cm from the                            incisors.                           Patchy moderate inflammation characterized by                            erosions, erythema and granularity was found in the                            gastric body, at the incisura and in the gastric                            antrum. Biopsies were taken with a cold forceps for                            histology and Helicobacter pylori testing.                           No other gross  lesions were noted in the entire                            examined stomach.                           A single 15 mm submucosal nodule was found in the                            second portion of the duodenum. It had a positive                            pillow sign suggestive of potential Lipoma.                            Tunneled biopsies were taken with a cold forceps                            for histology.                           No other gross lesions were noted in the duodenal                            bulb, in the first portion of the duodenum and in                            the second portion of the duodenum. Complications:            No immediate complications. Estimated Blood Loss:     Estimated blood loss was minimal. Impression:               - No gross lesions in esophagus. Biopsied. Z-line                            regular, 40 cm from the incisors.                           - Gastritis. Biopsied.                           - Submucosal nodule found in the  duodenum with                            positive pillow sign suggestive of lipoma. Tunnel                            biopsied.                           - No other gross lesions in the duodenal bulb, in                            the first portion of the duodenum and in the second  portion of the duodenum. Recommendation:           - Proceed to scheduled colonoscopy.                           - Await pathology results.                           - Observe patient's clinical course.                           - Recommend Omeprazole 20 mg daily for next 2-weeks                            at minimum.                           - If patient is found to have chronic gastritis or                            Intestinal metaplasia then would recommend repeat                            EGD and PPI therapy to be continuous for a period                            in time to be determined.                           - Consider possible CT-Abdomen +/- EUS based on                            further discussion with patient and finalization of                            results.                           - The findings and recommendations were discussed                            with the patient. Justice Britain, MD 05/16/2019 11:49:15 AM

## 2019-05-20 ENCOUNTER — Telehealth: Payer: Self-pay | Admitting: *Deleted

## 2019-05-20 ENCOUNTER — Encounter: Payer: Self-pay | Admitting: Gastroenterology

## 2019-05-20 ENCOUNTER — Telehealth: Payer: Self-pay

## 2019-05-20 NOTE — Telephone Encounter (Signed)
  Follow up Call-  Call back number 05/16/2019  Post procedure Call Back phone  # [phone]  Permission to leave phone message Yes     Patient questions:  Do you have a fever, pain , or abdominal swelling? No. Pain Score  0 *  Have you tolerated food without any problems? Yes.    Have you been able to return to your normal activities? Yes.    Do you have any questions about your discharge instructions: Diet   No. Medications  No. Follow up visit  No.  Do you have questions or concerns about your Care? No.  Actions: * If pain score is 4 or above: No action needed, pain <4. 1. Have you developed a fever since your procedure? no  2.   Have you had an respiratory symptoms (SOB or cough) since your procedure? no  3.   Have you tested positive for COVID 19 since your procedure no  4.   Have you had any family members/close contacts diagnosed with the COVID 19 since your procedure?  no   If yes to any of these questions please route to Joylene John, RN and Erenest Rasher, RN

## 2019-05-20 NOTE — Telephone Encounter (Signed)
  Follow up Call-  Call back number 05/16/2019  Post procedure Call Back phone  # [phone]  Permission to leave phone message Yes     Patient questions:  No message left.  Unable to reach patient.

## 2019-05-21 ENCOUNTER — Other Ambulatory Visit: Payer: Self-pay

## 2019-05-21 ENCOUNTER — Telehealth: Payer: Self-pay | Admitting: Gastroenterology

## 2019-05-21 DIAGNOSIS — A048 Other specified bacterial intestinal infections: Secondary | ICD-10-CM

## 2019-05-21 DIAGNOSIS — K3189 Other diseases of stomach and duodenum: Secondary | ICD-10-CM

## 2019-05-21 MED ORDER — AMOXICILLIN 500 MG PO TABS: 1000.0000 mg | ORAL_TABLET | Freq: Two times a day (BID) | ORAL | 0 refills | Status: AC

## 2019-05-21 MED ORDER — OMEPRAZOLE 20 MG PO CPDR: 20.0000 mg | DELAYED_RELEASE_CAPSULE | Freq: Two times a day (BID) | ORAL | 0 refills | Status: DC

## 2019-05-21 MED ORDER — PYLERA 140-125-125 MG PO CAPS
3.0000 | ORAL_CAPSULE | Freq: Three times a day (TID) | ORAL | 0 refills | Status: DC
Start: 2019-05-21 — End: 2019-05-21

## 2019-05-21 MED ORDER — CLARITHROMYCIN 500 MG PO TABS: 500.0000 mg | ORAL_TABLET | Freq: Two times a day (BID) | ORAL | 0 refills | Status: AC

## 2019-05-21 NOTE — Telephone Encounter (Signed)
Patient called to advise the medication sent today (Pylera) is too expensive would like generic sent

## 2019-05-21 NOTE — Telephone Encounter (Signed)
[facility] has already taking care of this.

## 2019-06-11 ENCOUNTER — Other Ambulatory Visit (HOSPITAL_COMMUNITY): Payer: 59

## 2019-06-14 ENCOUNTER — Ambulatory Visit (HOSPITAL_COMMUNITY)
Admission: RE | Admit: 2019-06-14 | Discharge: 2019-06-14 | Disposition: A | Discharge status: Home / Self Care | Payer: 59 | Source: Ambulatory Visit | Attending: Gastroenterology | Admitting: Gastroenterology

## 2019-06-14 ENCOUNTER — Encounter (HOSPITAL_COMMUNITY): Payer: Self-pay

## 2019-06-14 ENCOUNTER — Other Ambulatory Visit: Payer: Self-pay

## 2019-06-14 DIAGNOSIS — K3189 Other diseases of stomach and duodenum: Secondary | ICD-10-CM | POA: Diagnosis not present

## 2019-06-14 DIAGNOSIS — K429 Umbilical hernia without obstruction or gangrene: Secondary | ICD-10-CM | POA: Diagnosis not present

## 2019-06-14 MED ORDER — SODIUM CHLORIDE (PF) 0.9 % IJ SOLN
INTRAMUSCULAR | Status: AC
  Filled 2019-06-14: qty 50

## 2019-06-14 MED ORDER — IOHEXOL 300 MG/ML  SOLN
100.0000 mL | Freq: Once | INTRAMUSCULAR | Status: AC | PRN
  Administered 2019-06-14: 100 mL via INTRAVENOUS

## 2019-06-15 ENCOUNTER — Encounter: Payer: Self-pay | Admitting: Physician Assistant

## 2019-06-18 MED ORDER — METHOCARBAMOL 750 MG PO TABS: 750.0000 mg | ORAL_TABLET | Freq: Three times a day (TID) | ORAL | 2 refills | Status: DC | PRN

## 2019-06-18 NOTE — Telephone Encounter (Signed)
Please advise if okay to fill Methocarbamol ----750mg  - 1 tab q8 hrs prn # 90 with 2 refillsq8 hrs prn # 90 with 2 refills. Pt requesting.

## 2019-06-24 ENCOUNTER — Other Ambulatory Visit: Payer: Self-pay | Admitting: Gastroenterology

## 2019-10-15 ENCOUNTER — Other Ambulatory Visit: Payer: Self-pay

## 2019-10-15 ENCOUNTER — Ambulatory Visit (INDEPENDENT_AMBULATORY_CARE_PROVIDER_SITE_OTHER): Payer: 59 | Admitting: Pulmonary Disease

## 2019-10-15 ENCOUNTER — Encounter: Payer: Self-pay | Admitting: Pulmonary Disease

## 2019-10-15 VITALS — BP 114/68 | HR 66 | Temp 97.7°F | Ht 71.0 in | Wt 211.4 lb

## 2019-10-15 DIAGNOSIS — R0681 Apnea, not elsewhere classified: Secondary | ICD-10-CM | POA: Insufficient documentation

## 2019-10-15 DIAGNOSIS — G4733 Obstructive sleep apnea (adult) (pediatric): Secondary | ICD-10-CM | POA: Diagnosis not present

## 2019-10-15 NOTE — Progress Notes (Signed)
Subjective:    Patient ID: Shawn Franco, male    DOB: September 23, 1970, 49 y.o.   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 His wife has noted loud snoring for many years but more recently has witnessed apneic episodes.  She took a video apparently and noted an apnea lasting more than a minute. He denies excessive daytime somnolence but also admits to increased caffeine intake.  He works 12-hour shifts, 10 in a row before he has a week off.  On his days at work he will drink about 2-3 large cups of coffee, last cup before 2 PM.  He reports nocturia. Epworth sleepiness score is 2 and he denies any problems with hypersomnolence, fatigue or problems driving He has always been rate to bed.  Bedtime is around midnight, sleep latency about 30 to 60 minutes, he prefers to start sleeping prone but apparently is a restless sleeper and turns around a lot in his sleep, uses 2 pillows, occasional nocturnal awakening 3 times out of her week for nocturia and is out of bed by 6 AM feeling rested without headaches or dryness of mouth. On his days off he may stay in bed till 8 or taken 1 hour nap later on the day. There is no history suggestive of cataplexy, sleep paralysis or parasomnias   He also reports weight gain of about 20 pounds and admits to more sedentary lifestyle.  He has been unable to lose this weight  Past Medical History:  Diagnosis Date  . Gastroesophageal reflux disease without esophagitis 02/13/2017  . Seasonal allergic rhinitis due to pollen 02/13/2017   Past Surgical History:  Procedure Laterality Date  . EAR CANALOPLASTY    . NOSE SURGERY      Allergies  Allergen Reactions  . Procaine Anaphylaxis  . Tetanus Toxoids Anaphylaxis    Tetanus Serum    Social History   Socioeconomic History  . Marital status: Married    Spouse name: Not on file  . Number of children: Not on file  . Years of education: Not  on file  . Highest education level: Not on file  Occupational History  . Not on file  Tobacco Use  . Smoking status: Never Smoker  . Smokeless tobacco: Never Used  Substance and Sexual Activity  . Alcohol use: Never  . Drug use: Never  . Sexual activity: Yes  Other Topics Concern  . Not on file  Social History Narrative   Twin 51 yo and 49 yo - as of 2021   Social Determinants of Health   Financial Resource Strain:   . Difficulty of Paying Living Expenses: Not on file  Food Insecurity:   . Worried About Charity fundraiser in the Last Year: Not on file  . Ran Out of Food in the Last Year: Not on file  Transportation Needs:   . Lack of Transportation (Medical): Not on file  . Lack of Transportation (Non-Medical): Not on file  Physical Activity:   . Days of Exercise per Week: Not on file  . Minutes of Exercise per Session: Not on file  Stress:   . Feeling of Stress : Not on file  Social Connections:   . Frequency of Communication with Friends and Family: Not on file  . Frequency of Social Gatherings with Friends and Family: Not on file  . Attends Religious Services: Not on file  . Active Member of Clubs or Organizations: Not on file  .  Attends Archivist Meetings: Not on file  . Marital Status: Not on file  Intimate Partner Violence:   . Fear of Current or Ex-Partner: Not on file  . Emotionally Abused: Not on file  . Physically Abused: Not on file  . Sexually Abused: Not on file    Family History  Problem Relation Age of Onset  . Diabetes Mother   . Hypertension Mother       Review of Systems Acid heartburn Nasal congestion attributed to allergies Sneezing Denies hypersomnolence. Loud snoring Witnessed apneas Nocturia    Objective:   Physical Exam  Gen. Pleasant, well-nourished, in no distress ENT - no thrush, no pallor/icterus,no post nasal drip, 51mm overbite Neck: No JVD, no thyromegaly, no carotid bruits Lungs: no use of accessory muscles,  no dullness to percussion, clear without rales or rhonchi  Cardiovascular: Rhythm regular, heart sounds  normal, no murmurs or gallops, no peripheral edema Musculoskeletal: No deformities, no cyanosis or clubbing        Assessment & Plan:

## 2019-10-15 NOTE — Assessment & Plan Note (Signed)
Given excessive daytime somnolence, , witnessed apneas & loud snoring, obstructive sleep apnea is possible & an overnight polysomnogram will be scheduled as a home study. The pathophysiology of obstructive sleep apnea , it's cardiovascular consequences & modes of treatment including CPAP were discused with the patient in detail & they evidenced understanding.  Red flags are witnessed apneas, loud snoring.  He does not have excessive hyper somnolence, but this may be masked by increased caffeine intake. He would be amenable to using CPAP if needed

## 2019-10-15 NOTE — Patient Instructions (Signed)
Home sleep study We discussed treatment options 

## 2019-11-01 DIAGNOSIS — Z20828 Contact with and (suspected) exposure to other viral communicable diseases: Secondary | ICD-10-CM | POA: Diagnosis not present

## 2019-11-11 ENCOUNTER — Encounter: Payer: Self-pay | Admitting: Physician Assistant

## 2019-11-11 ENCOUNTER — Other Ambulatory Visit: Payer: Self-pay | Admitting: Physician Assistant

## 2019-11-11 ENCOUNTER — Telehealth: Payer: Self-pay

## 2019-11-11 DIAGNOSIS — R42 Dizziness and giddiness: Secondary | ICD-10-CM

## 2019-11-11 NOTE — Telephone Encounter (Signed)
Pt called asking if he could get lab work done before he sees [place] next week. Pt has been experiencing dizzziness. Pt states he is going to send Aldona [facility] a [employer]

## 2019-11-11 NOTE — Telephone Encounter (Signed)
Please see message. °

## 2019-11-12 ENCOUNTER — Ambulatory Visit (HOSPITAL_COMMUNITY)
Admission: RE | Admit: 2019-11-12 | Discharge: 2019-11-12 | Disposition: A | Discharge status: Home / Self Care | Payer: 59 | Source: Ambulatory Visit | Attending: Physician Assistant | Admitting: Physician Assistant

## 2019-11-12 ENCOUNTER — Other Ambulatory Visit (HOSPITAL_COMMUNITY)
Admission: RE | Admit: 2019-11-12 | Discharge: 2019-11-12 | Disposition: A | Discharge status: Home / Self Care | Payer: 59 | Source: Ambulatory Visit | Attending: Physician Assistant | Admitting: Physician Assistant

## 2019-11-12 ENCOUNTER — Other Ambulatory Visit: Payer: Self-pay

## 2019-11-12 ENCOUNTER — Encounter: Payer: Self-pay | Admitting: Physician Assistant

## 2019-11-12 DIAGNOSIS — R42 Dizziness and giddiness: Secondary | ICD-10-CM | POA: Insufficient documentation

## 2019-11-12 DIAGNOSIS — A048 Other specified bacterial intestinal infections: Secondary | ICD-10-CM

## 2019-11-12 DIAGNOSIS — I6523 Occlusion and stenosis of bilateral carotid arteries: Secondary | ICD-10-CM | POA: Diagnosis not present

## 2019-11-12 LAB — COMPREHENSIVE METABOLIC PANEL
ALT: 34 U/L (ref 0–44)
AST: 31 U/L (ref 15–41)
Albumin: 4.7 g/dL (ref 3.5–5.0)
Alkaline Phosphatase: 32 U/L — ABNORMAL LOW (ref 38–126)
Anion gap: 15 (ref 5–15)
BUN: 14 mg/dL (ref 6–20)
CO2: 26 mmol/L (ref 22–32)
Calcium: 10 mg/dL (ref 8.9–10.3)
Chloride: 101 mmol/L (ref 98–111)
Creatinine, Ser: 1.08 mg/dL (ref 0.61–1.24)
GFR, Estimated: >60 mL/min (ref >60)
Glucose, Bld: 105 mg/dL — ABNORMAL HIGH (ref 70–99)
Potassium: 3.8 mmol/L (ref 3.5–5.1)
Sodium: 142 mmol/L (ref 135–145)
Total Bilirubin: 0.9 mg/dL (ref 0.3–1.2)
Total Protein: 8.1 g/dL (ref 6.5–8.1)

## 2019-11-12 LAB — ECHOCARDIOGRAM COMPLETE
AR max vel: 1.89 cm2
AV Area VTI: 2.02 cm2
AV Area mean vel: 1.89 cm2
AV Mean grad: 5.9 mmHg
AV Peak grad: 12.8 mmHg
Ao pk vel: 1.79 m/s
Area-P 1/2: 2.74 cm2
S' Lateral: 2.31 cm

## 2019-11-12 LAB — MAGNESIUM: Magnesium: 1.8 mg/dL (ref 1.7–2.4)

## 2019-11-12 LAB — CBC WITH DIFFERENTIAL/PLATELET
Abs Immature Granulocytes: 0.02 10*3/uL (ref 0.00–0.07)
Basophils Absolute: 0 10*3/uL (ref 0.0–0.1)
Basophils Relative: 0 %
Eosinophils Absolute: 0.1 10*3/uL (ref 0.0–0.5)
Eosinophils Relative: 3 %
HCT: 39.2 % (ref 39.0–52.0)
Hemoglobin: 13.2 g/dL (ref 13.0–17.0)
Immature Granulocytes: 1 %
Lymphocytes Relative: 43 %
Lymphs Abs: 1.3 10*3/uL (ref 0.7–4.0)
MCH: 28.8 pg (ref 26.0–34.0)
MCHC: 33.7 g/dL (ref 30.0–36.0)
MCV: 85.6 fL (ref 80.0–100.0)
Monocytes Absolute: 0.2 10*3/uL (ref 0.1–1.0)
Monocytes Relative: 7 %
Neutro Abs: 1.4 10*3/uL — ABNORMAL LOW (ref 1.7–7.7)
Neutrophils Relative %: 46 %
Platelets: 211 10*3/uL (ref 150–400)
RBC: 4.58 MIL/uL (ref 4.22–5.81)
RDW: 11.9 % (ref 11.5–15.5)
WBC: 3.1 10*3/uL — ABNORMAL LOW (ref 4.0–10.5)
nRBC: 0 % (ref 0.0–0.2)

## 2019-11-12 LAB — TSH: TSH: 1.386 u[IU]/mL (ref 0.350–4.500)

## 2019-11-12 NOTE — Progress Notes (Signed)
*  PRELIMINARY RESULTS* Echocardiogram 2D Echocardiogram has been performed.  Samuel [place] 11/12/2019, 2:05 PM

## 2019-11-13 LAB — HEMOGLOBIN A1C
Hgb A1c MFr Bld: 5 % (ref 4.8–5.6)
Mean Plasma Glucose: 97 mg/dL

## 2019-11-13 LAB — C-PEPTIDE: C-Peptide: 1.9 ng/mL (ref 1.1–4.4)

## 2019-11-14 ENCOUNTER — Ambulatory Visit: Payer: 59 | Admitting: General Surgery

## 2019-11-19 ENCOUNTER — Ambulatory Visit: Payer: 59 | Admitting: Physician Assistant

## 2019-11-19 ENCOUNTER — Encounter: Payer: Self-pay | Admitting: Physician Assistant

## 2019-11-19 ENCOUNTER — Other Ambulatory Visit: Payer: Self-pay

## 2019-11-19 VITALS — BP 118/80 | HR 67 | Temp 98.2°F | Ht 71.0 in | Wt 207.2 lb

## 2019-11-19 DIAGNOSIS — K219 Gastro-esophageal reflux disease without esophagitis: Secondary | ICD-10-CM

## 2019-11-19 DIAGNOSIS — R42 Dizziness and giddiness: Secondary | ICD-10-CM

## 2019-11-19 DIAGNOSIS — B009 Herpesviral infection, unspecified: Secondary | ICD-10-CM

## 2019-11-19 DIAGNOSIS — M62838 Other muscle spasm: Secondary | ICD-10-CM | POA: Diagnosis not present

## 2019-11-19 DIAGNOSIS — R351 Nocturia: Secondary | ICD-10-CM

## 2019-11-19 MED ORDER — METHOCARBAMOL 750 MG PO TABS: 750.0000 mg | ORAL_TABLET | Freq: Three times a day (TID) | ORAL | 2 refills | Status: DC | PRN

## 2019-11-19 MED ORDER — OMEPRAZOLE 20 MG PO CPDR: 20.0000 mg | DELAYED_RELEASE_CAPSULE | Freq: Two times a day (BID) | ORAL | 2 refills | Status: DC

## 2019-11-19 MED ORDER — MECLIZINE HCL 25 MG PO TABS: 25.0000 mg | ORAL_TABLET | Freq: Three times a day (TID) | ORAL | 0 refills | Status: DC | PRN

## 2019-11-19 MED ORDER — FLUTICASONE PROPIONATE 50 MCG/ACT NA SUSP: 2.0000 | NASAL | 4 refills | Status: AC | PRN

## 2019-11-19 MED ORDER — VALACYCLOVIR HCL 1 G PO TABS: 2000.0000 mg | ORAL_TABLET | Freq: Two times a day (BID) | ORAL | 1 refills | Status: DC

## 2019-11-19 MED ORDER — HYDROXYZINE HCL 25 MG PO TABS: 25.0000 mg | ORAL_TABLET | Freq: Every evening | ORAL | 0 refills | Status: DC | PRN

## 2019-11-19 MED ORDER — TADALAFIL 20 MG PO TABS: 20.0000 mg | ORAL_TABLET | Freq: Every day | ORAL | 0 refills | Status: DC | PRN

## 2019-11-19 NOTE — Progress Notes (Signed)
Shawn Franco is a 49 y.o. male here for a new problem.  I acted as a Education administrator for [employer], PA-C Anselmo Pickler, LPN   History of Present Illness:   Chief Complaint  Patient presents with  . Dizziness    HPI   Dizziness Pt c/o dizziness off and on since October 15th and nausea with episodes of dizziness. Symptoms lasts a few minutes to a half hour. On Oct 21st, tried to drive but couldn't because he was so dizzy, had a glucometer available to him and he checked his blood sugar and it was 36.  Denies blurred vision, headaches, CP, SOB.  He has discussed his case with cardiology friends.  Labwork was ordered and completed thus far: HgbA1c, CBC, CMP, C-peptide, magnesium, TSH. Echocardiogram and carotid u/s WNL.  He does note that since 50 months old he has had chronic ear infections, had tympanoplasty at [facility] at 2016-2017. Still has tiny perforation. Has constant tinnitus and hearing loss. He is wondering if this is possibly contributing to his symptoms.  HSV Uses valtrex prn when he gets outbreaks 2/2 stress. Usually occurs on the R side of his mouth. Denies new lesions or other concerns.  Muscle spasms Uses robaxin prn for occasional muscle spasms with gardening and other activities, no new or worsening symptoms.  Chronic GERD Takes prilosec (40 mg) daily to help with his GERD. Denies rectal bleeding, unintentional weight loss, or other concerns.  Nocturia Takes 20 mg cialis prn to help prevent nocturia so he can get longer periods of sleep during his week of work. Needs refill. Denies hematuria, dysuria, or other concerns.  Past Medical History:  Diagnosis Date  . Gastroesophageal reflux disease without esophagitis 02/13/2017  . Seasonal allergic rhinitis due to pollen 02/13/2017     Social History   Tobacco Use  . Smoking status: Never Smoker  . Smokeless tobacco: Never Used  Substance Use Topics  . Alcohol use: Never  . Drug use: Never    Past Surgical  History:  Procedure Laterality Date  . EAR CANALOPLASTY    . NOSE SURGERY      Family History  Problem Relation Age of Onset  . Diabetes Mother   . Hypertension Mother     Allergies  Allergen Reactions  . Procaine Anaphylaxis  . Tetanus Toxoids Anaphylaxis    Tetanus Serum    Current Medications:   Current Outpatient Medications:  .  cetirizine (ZYRTEC) 10 MG tablet, Take 10 mg by mouth as needed. , Disp: , Rfl:  .  fluticasone (FLONASE) 50 MCG/ACT nasal spray, Place 2 sprays into both nostrils as needed., Disp: 16 g, Rfl: 4 .  Ibuprofen 200 MG CAPS, Take by mouth., Disp: , Rfl:  .  methocarbamol (ROBAXIN) 750 MG tablet, Take 1 tablet (750 mg total) by mouth every 8 (eight) hours as needed for muscle spasms., Disp: 90 tablet, Rfl: 2 .  tadalafil (CIALIS) 20 MG tablet, Take 1 tablet (20 mg total) by mouth daily as needed (BPH)., Disp: 20 tablet, Rfl: 0 .  valACYclovir (VALTREX) 1000 MG tablet, Take 2 tablets (2,000 mg total) by mouth 2 (two) times daily. x1 day and may repeat with future outbreaks, Disp: 30 tablet, Rfl: 1 .  hydrOXYzine (ATARAX/VISTARIL) 25 MG tablet, Take 1 tablet (25 mg total) by mouth at bedtime as needed for anxiety (insomnia). Take one 25 mg tablet 30-60 minutes prior to bedtime for insomnia, anxiety. May increase to two tablets., Disp: 60 tablet, Rfl: 0 .  meclizine (ANTIVERT) 25 MG tablet, Take 1 tablet (25 mg total) by mouth 3 (three) times daily as needed for dizziness., Disp: 30 tablet, Rfl: 0 .  omeprazole (PRILOSEC) 20 MG capsule, Take 1 capsule (20 mg total) by mouth 2 (two) times daily before a meal., Disp: 60 capsule, Rfl: 2   Review of Systems:   ROS  Negative unless otherwise specified per HPI.  Vitals:   Vitals:   11/19/19 1101  BP: 118/80  Pulse: 67  Temp: 98.2 F (36.8 C)  TempSrc: Temporal  SpO2: 97%  Weight: 207 lb 4 oz (94 kg)  Height: 5\' 11"  (1.803 m)     Body mass index is 28.91 kg/m.  Physical Exam:   Physical  Exam Vitals and nursing note reviewed.  Constitutional:      Appearance: He is well-developed.  HENT:     Head: Normocephalic.     Right Ear: Tympanic membrane, ear canal and external ear normal.     Left Ear: Ear canal and external ear normal. Tympanic membrane is perforated (very small, located in center of TM).  Eyes:     Conjunctiva/sclera: Conjunctivae normal.     Pupils: Pupils are equal, round, and reactive to light.  Pulmonary:     Effort: Pulmonary effort is normal.  Musculoskeletal:        General: Normal range of motion.     Cervical back: Normal range of motion.  Skin:    General: Skin is warm and dry.  Neurological:     Mental Status: He is alert and oriented to person, place, and time.  Psychiatric:        Behavior: Behavior normal.        Thought Content: Thought content normal.        Judgment: Judgment normal.     Assessment and Plan:   Pier was seen today for dizziness.  Diagnoses and all orders for this visit:  Dizziness Unclear etiology. Will trial flonase, meclizine or atarax (depending on which is less sedating for him) as well as consistent hydration and nutrition. Offered Holter monitor and/or CT scan of brain but he declined. He would like to pursue a Cardiac CT and will let us know specifically which test he would like for [place] to order for him. He will keep [place] posted on his symptoms and further evaluation.  Herpes simplex Refill appropriate -     valACYclovir (VALTREX) 1000 MG tablet; Take 2 tablets (2,000 mg total) by mouth 2 (two) times daily. x1 day and may repeat with future outbreaks  Muscle spasm Refill appropriate  -     methocarbamol (ROBAXIN) 750 MG tablet; Take 1 tablet (750 mg total) by mouth every 8 (eight) hours as needed for muscle spasms.  Chronic GERD Refill appropriate  -     omeprazole (PRILOSEC) 20 MG capsule; Take 1 capsule (20 mg total) by mouth 2 (two) times daily before a meal.  Nocturia Refill appropriate  -      tadalafil (CIALIS) 20 MG tablet; Take 1 tablet (20 mg total) by mouth daily as needed (BPH).  Other orders -     fluticasone (FLONASE) 50 MCG/ACT nasal spray; Place 2 sprays into both nostrils as needed. -     hydrOXYzine (ATARAX/VISTARIL) 25 MG tablet; Take 1 tablet (25 mg total) by mouth at bedtime as needed for anxiety (insomnia). Take one 25 mg tablet 30-60 minutes prior to bedtime for insomnia, anxiety. May increase to two tablets. -  meclizine (ANTIVERT) 25 MG tablet; Take 1 tablet (25 mg total) by mouth 3 (three) times daily as needed for dizziness.   CMA or LPN served as scribe during this visit. History, Physical, and Plan performed by medical provider. The above documentation has been reviewed and is accurate and complete.   Inda Coke, PA-C

## 2019-11-21 ENCOUNTER — Other Ambulatory Visit: Payer: Self-pay | Admitting: Physician Assistant

## 2019-11-21 DIAGNOSIS — R42 Dizziness and giddiness: Secondary | ICD-10-CM

## 2019-11-27 ENCOUNTER — Encounter: Payer: Self-pay | Admitting: Physician Assistant

## 2019-11-27 ENCOUNTER — Other Ambulatory Visit: Payer: Self-pay

## 2019-11-27 ENCOUNTER — Other Ambulatory Visit: Payer: Self-pay | Admitting: Physician Assistant

## 2019-11-27 ENCOUNTER — Ambulatory Visit: Payer: 59

## 2019-11-27 DIAGNOSIS — G4733 Obstructive sleep apnea (adult) (pediatric): Secondary | ICD-10-CM | POA: Diagnosis not present

## 2019-11-27 DIAGNOSIS — R42 Dizziness and giddiness: Secondary | ICD-10-CM

## 2019-11-27 MED ORDER — OMEPRAZOLE 20 MG PO CPDR: 20.0000 mg | DELAYED_RELEASE_CAPSULE | Freq: Two times a day (BID) | ORAL | 2 refills | Status: DC

## 2019-11-27 MED ORDER — TADALAFIL 20 MG PO TABS
20.0000 mg | ORAL_TABLET | Freq: Every day | ORAL | 0 refills | Status: DC | PRN
Start: 2019-11-27 — End: 2020-03-19

## 2019-11-27 MED FILL — OMEPRAZOLE 20 MG CAP: 20 | 30 days supply | Qty: 60 | Fill #0

## 2019-11-27 MED FILL — TADALAFIL 20 MG TABS: 20 | 90 days supply | Qty: 18 | Fill #0

## 2019-11-28 ENCOUNTER — Ambulatory Visit (INDEPENDENT_AMBULATORY_CARE_PROVIDER_SITE_OTHER): Payer: 59 | Admitting: General Surgery

## 2019-11-28 ENCOUNTER — Encounter: Payer: Self-pay | Admitting: General Surgery

## 2019-11-28 VITALS — BP 113/75 | HR 56 | Temp 98.1°F | Resp 12 | Ht 71.0 in | Wt 207.0 lb

## 2019-11-28 DIAGNOSIS — D1720 Benign lipomatous neoplasm of skin and subcutaneous tissue of unspecified limb: Secondary | ICD-10-CM

## 2019-11-28 DIAGNOSIS — D1724 Benign lipomatous neoplasm of skin and subcutaneous tissue of left leg: Secondary | ICD-10-CM

## 2019-11-28 DIAGNOSIS — D1723 Benign lipomatous neoplasm of skin and subcutaneous tissue of right leg: Secondary | ICD-10-CM | POA: Diagnosis not present

## 2019-11-28 NOTE — Progress Notes (Signed)
Shawn Franco; [id number]; 1970-09-04   HPI Patient is a 49 year old black male who was referred to my care by himself for excision of 2 subcutaneous masses in the lower extremities.  He has noted these subcutaneous masses for some time now.  There is one on each anterior thigh.  He is requesting excision and pathologic examination. Past Medical History:  Diagnosis Date  . Gastroesophageal reflux disease without esophagitis 02/13/2017  . Seasonal allergic rhinitis due to pollen 02/13/2017    Past Surgical History:  Procedure Laterality Date  . EAR CANALOPLASTY    . NOSE SURGERY      Family History  Problem Relation Age of Onset  . Diabetes Mother   . Hypertension Mother     Current Outpatient Medications on File Prior to Visit  Medication Sig Dispense Refill  . cetirizine (ZYRTEC) 10 MG tablet Take 10 mg by mouth as needed.     . fluticasone (FLONASE) 50 MCG/ACT nasal spray Place 2 sprays into both nostrils as needed. 16 g 4  . hydrOXYzine (ATARAX/VISTARIL) 25 MG tablet Take 1 tablet (25 mg total) by mouth at bedtime as needed for anxiety (insomnia). Take one 25 mg tablet 30-60 minutes prior to bedtime for insomnia, anxiety. May increase to two tablets. 60 tablet 0  . Ibuprofen 200 MG CAPS Take by mouth.    . meclizine (ANTIVERT) 25 MG tablet Take 1 tablet (25 mg total) by mouth 3 (three) times daily as needed for dizziness. 30 tablet 0  . methocarbamol (ROBAXIN) 750 MG tablet Take 1 tablet (750 mg total) by mouth every 8 (eight) hours as needed for muscle spasms. 90 tablet 2  . omeprazole (PRILOSEC) 20 MG capsule Take 1 capsule (20 mg total) by mouth 2 (two) times daily before a meal. 60 capsule 2  . tadalafil (CIALIS) 20 MG tablet Take 1 tablet (20 mg total) by mouth daily as needed (BPH). 20 tablet 0  . valACYclovir (VALTREX) 1000 MG tablet Take 2 tablets (2,000 mg total) by mouth 2 (two) times daily. x1 day and may repeat with future outbreaks 30 tablet 1   No current  facility-administered medications on file prior to visit.    Allergies  Allergen Reactions  . Procaine Anaphylaxis  . Tetanus Toxoids Anaphylaxis    Tetanus Serum    Social History   Substance and Sexual Activity  Alcohol Use Never    Social History   Tobacco Use  Smoking Status Never Smoker  Smokeless Tobacco Never Used    Review of Systems  Constitutional: Negative.   HENT: Negative.   Eyes: Negative.   Respiratory: Negative.   Cardiovascular: Negative.   Gastrointestinal: Negative.   Genitourinary: Negative.   Musculoskeletal: Negative.   Skin: Negative.   Neurological: Negative.   Endo/Heme/Allergies: Negative.   Psychiatric/Behavioral: Negative.     Objective   Vitals:   11/28/19 1116  BP: 113/75  Pulse: (!) 56  Resp: 12  Temp: 98.1 F (36.7 C)  SpO2: 98%    Physical Exam Vitals reviewed.  Constitutional:      Appearance: Normal appearance. He is not ill-appearing.  Cardiovascular:     Rate and Rhythm: Normal rate and regular rhythm.     Heart sounds: Normal heart sounds. No murmur heard.  No friction rub. No gallop.   Pulmonary:     Effort: Pulmonary effort is normal. No respiratory distress.     Breath sounds: Normal breath sounds. No stridor. No wheezing, rhonchi or rales.  Skin:  General: Skin is warm and dry.     Comments: A 2 cm rubbery oval subcutaneous mass is noted in the left anterior thigh.  A 3 cm rubbery oval subcutaneous mass is noted in the right anterior thigh.  Both are mobile.  No induration or erythema noted.  Neurological:     Mental Status: He is alert and oriented to person, place, and time.   Procedure note: Informed consent obtained.  1% Xylocaine was used for local anesthesia.  Both anterior thighs were prepped and draped using the usual sterile technique with Betadine.  An incision was made over both masses.  Both were excised without difficulty and were noted to be lipomas grossly.  Both were sent to pathology for  further examination.  A bleeding was controlled using eye cautery.  Both incisions were closed using a 4-0 Monocryl subcuticular suture.  Dermabond was applied.  Patient tolerated the procedure well.  Assessment  Lipomas x2, lower extremities Plan   May shower tomorrow.  Further management is pending pathology results.

## 2019-11-28 NOTE — Patient Instructions (Signed)
Lipoma  A lipoma is a noncancerous (benign) tumor that is made up of fat cells. This is a very common type of soft-tissue growth. Lipomas are usually found under the skin (subcutaneous). They may occur in any tissue of the body that contains fat. Common areas for lipomas to appear include the back, arms, shoulders, buttocks, and thighs. Lipomas grow slowly, and they are usually painless. Most lipomas do not cause problems and do not require treatment. What are the causes? The cause of this condition is not known. What increases the risk? You are more likely to develop this condition if:  You are 40-60 years old.  You have a family history of lipomas. What are the signs or symptoms? A lipoma usually appears as a small, round bump under the skin. In most cases, the lump will:  Feel soft or rubbery.  Not cause pain or other symptoms. However, if a lipoma is located in an area where it pushes on nerves, it can become painful or cause other symptoms. How is this diagnosed? A lipoma can usually be diagnosed with a physical exam. You may also have tests to confirm the diagnosis and to rule out other conditions. Tests may include:  Imaging tests, such as a CT scan or an MRI.  Removal of a tissue sample to be looked at under a microscope (biopsy). How is this treated? Treatment for this condition depends on the size of the lipoma and whether it is causing any symptoms.  For small lipomas that are not causing problems, no treatment is needed.  If a lipoma is bigger or it causes problems, surgery may be done to remove the lipoma. Lipomas can also be removed to improve appearance. Most often, the procedure is done after applying a medicine that numbs the area (local anesthetic).  Liposuction may be done to reduce the size of the lipoma before it is removed through surgery, or it may be done to remove the lipoma. Lipomas are removed with this method in order to limit incision size and scarring. A  liposuction tube is inserted through a small incision into the lipoma, and the contents of the lipoma are removed through the tube with suction. Follow these instructions at home:  Watch your lipoma for any changes.  Keep all follow-up visits as told by your health care provider. This is important. Contact a health care provider if:  Your lipoma becomes larger or hard.  Your lipoma becomes painful, red, or increasingly swollen. These could be signs of infection or a more serious condition. Get help right away if:  You develop tingling or numbness in an area near the lipoma. This could indicate that the lipoma is causing nerve damage. Summary  A lipoma is a noncancerous tumor that is made up of fat cells.  Most lipomas do not cause problems and do not require treatment.  If a lipoma is bigger or it causes problems, surgery may be done to remove the lipoma.  Contact a health care provider if your lipoma becomes larger or hard, or if it becomes painful, red, or increasingly swollen. Pain, redness, and swelling could be signs of infection or a more serious condition. This information is not intended to replace advice given to you by your health care provider. Make sure you discuss any questions you have with your health care provider. Document Revised: 08/20/2018 Document Reviewed: 08/20/2018 Elsevier Patient Education  2020 [employer]  

## 2019-11-29 ENCOUNTER — Ambulatory Visit (HOSPITAL_COMMUNITY)
Admission: RE | Admit: 2019-11-29 | Discharge: 2019-11-29 | Disposition: A | Discharge status: Home / Self Care | Payer: 59 | Source: Ambulatory Visit | Attending: Physician Assistant | Admitting: Physician Assistant

## 2019-11-29 ENCOUNTER — Other Ambulatory Visit: Payer: Self-pay

## 2019-11-29 DIAGNOSIS — H9319 Tinnitus, unspecified ear: Secondary | ICD-10-CM | POA: Diagnosis not present

## 2019-11-29 DIAGNOSIS — R42 Dizziness and giddiness: Secondary | ICD-10-CM | POA: Diagnosis not present

## 2019-12-02 ENCOUNTER — Telehealth (HOSPITAL_COMMUNITY): Payer: Self-pay | Admitting: Emergency Medicine

## 2019-12-02 NOTE — Telephone Encounter (Signed)
Reaching out to patient to offer assistance regarding upcoming cardiac imaging study; pt verbalizes understanding of appt date/time, parking situation and where to check in, pre-test NPO status and medications ordered, and verified current allergies; name and call back number provided for further questions should they arise Marchia Bond RN Navigator Cardiac [facility] [place] [facility] [phone] office [phone] cell  Pt verbalized understanding not to take ED medications or antihistamines prior to tomorrows appt. Clarise Cruz

## 2019-12-03 ENCOUNTER — Encounter (HOSPITAL_COMMUNITY): Payer: Self-pay

## 2019-12-03 ENCOUNTER — Ambulatory Visit (HOSPITAL_COMMUNITY)
Admission: RE | Admit: 2019-12-03 | Discharge: 2019-12-03 | Disposition: A | Discharge status: Home / Self Care | Payer: 59 | Source: Ambulatory Visit | Attending: Physician Assistant | Admitting: Physician Assistant

## 2019-12-03 ENCOUNTER — Other Ambulatory Visit: Payer: Self-pay

## 2019-12-03 DIAGNOSIS — R42 Dizziness and giddiness: Secondary | ICD-10-CM | POA: Insufficient documentation

## 2019-12-03 DIAGNOSIS — R55 Syncope and collapse: Secondary | ICD-10-CM | POA: Diagnosis not present

## 2019-12-03 MED ORDER — NITROGLYCERIN 0.4 MG SL SUBL
SUBLINGUAL_TABLET | SUBLINGUAL | Status: AC
  Filled 2019-12-03: qty 2

## 2019-12-03 MED ORDER — NITROGLYCERIN 0.4 MG SL SUBL
0.8000 mg | SUBLINGUAL_TABLET | Freq: Once | SUBLINGUAL | Status: AC
  Administered 2019-12-03: 0.8 mg via SUBLINGUAL

## 2019-12-03 MED ORDER — IOHEXOL 350 MG/ML SOLN
80.0000 mL | Freq: Once | INTRAVENOUS | Status: AC | PRN
  Administered 2019-12-03: 80 mL via INTRAVENOUS

## 2019-12-10 DIAGNOSIS — G4733 Obstructive sleep apnea (adult) (pediatric): Secondary | ICD-10-CM | POA: Diagnosis not present

## 2019-12-11 ENCOUNTER — Other Ambulatory Visit: Payer: Self-pay | Admitting: Physician Assistant

## 2020-01-27 DIAGNOSIS — Z20828 Contact with and (suspected) exposure to other viral communicable diseases: Secondary | ICD-10-CM | POA: Diagnosis not present

## 2020-02-05 ENCOUNTER — Telehealth: Payer: Self-pay

## 2020-02-05 NOTE — Telephone Encounter (Signed)
Recall letter sent asking patient to contact office to schedule EUS. I have also called and left message on cell asking for return call.

## 2020-03-17 MED FILL — OMEPRAZOLE 20 MG CAP: 20 | 30 days supply | Qty: 60 | Fill #1

## 2020-03-19 ENCOUNTER — Other Ambulatory Visit: Payer: Self-pay | Admitting: *Deleted

## 2020-03-19 ENCOUNTER — Other Ambulatory Visit: Payer: Self-pay | Admitting: Physician Assistant

## 2020-03-19 MED ORDER — TADALAFIL 20 MG PO TABS
20.0000 mg | ORAL_TABLET | Freq: Every day | ORAL | 2 refills | Status: DC | PRN
Start: 2020-03-19 — End: 2020-03-19

## 2020-03-19 MED FILL — TADALAFIL 20 MG TABS: 20 | 30 days supply | Qty: 6 | Fill #0

## 2020-05-13 ENCOUNTER — Other Ambulatory Visit (HOSPITAL_COMMUNITY): Payer: Self-pay

## 2020-05-13 MED FILL — Omeprazole Cap Delayed Release 20 MG: ORAL | 30 days supply | Qty: 60 | Fill #0 | Status: AC

## 2020-05-13 MED FILL — Tadalafil Tab 20 MG: ORAL | 30 days supply | Qty: 6 | Fill #0 | Status: AC

## 2020-05-18 ENCOUNTER — Other Ambulatory Visit (HOSPITAL_COMMUNITY): Payer: Self-pay

## 2020-06-02 ENCOUNTER — Ambulatory Visit: Payer: 59 | Admitting: Gastroenterology

## 2020-07-07 DIAGNOSIS — Z125 Encounter for screening for malignant neoplasm of prostate: Secondary | ICD-10-CM | POA: Diagnosis not present

## 2020-07-07 DIAGNOSIS — Z0000 Encounter for general adult medical examination without abnormal findings: Secondary | ICD-10-CM | POA: Diagnosis not present

## 2020-07-07 DIAGNOSIS — R5382 Chronic fatigue, unspecified: Secondary | ICD-10-CM | POA: Diagnosis not present

## 2020-09-16 DIAGNOSIS — Z20828 Contact with and (suspected) exposure to other viral communicable diseases: Secondary | ICD-10-CM | POA: Diagnosis not present

## 2020-10-08 ENCOUNTER — Telehealth: Payer: Self-pay | Admitting: Gastroenterology

## 2020-10-08 NOTE — Telephone Encounter (Signed)
Appt made to see Shawn Franco on 11/03/20 at 130 pm.  Dr Denton Brick states he has several GI complaints to discuss with [name].  He did not wish to elaborate.  FYI JESS

## 2020-10-08 NOTE — Telephone Encounter (Signed)
Dr. Joesph Fillers called asking if he can be worked in [employer] schedule for GI work up. Thanks

## 2020-11-03 ENCOUNTER — Ambulatory Visit (INDEPENDENT_AMBULATORY_CARE_PROVIDER_SITE_OTHER): Payer: 59 | Admitting: Gastroenterology

## 2020-11-03 ENCOUNTER — Other Ambulatory Visit: Payer: 59

## 2020-11-03 ENCOUNTER — Encounter: Payer: Self-pay | Admitting: Gastroenterology

## 2020-11-03 VITALS — BP 106/70 | HR 75 | Ht 71.0 in | Wt 200.0 lb

## 2020-11-03 DIAGNOSIS — R195 Other fecal abnormalities: Secondary | ICD-10-CM

## 2020-11-03 DIAGNOSIS — R140 Abdominal distension (gaseous): Secondary | ICD-10-CM | POA: Diagnosis not present

## 2020-11-03 DIAGNOSIS — A048 Other specified bacterial intestinal infections: Secondary | ICD-10-CM

## 2020-11-03 DIAGNOSIS — R194 Change in bowel habit: Secondary | ICD-10-CM | POA: Diagnosis not present

## 2020-11-03 DIAGNOSIS — K3189 Other diseases of stomach and duodenum: Secondary | ICD-10-CM

## 2020-11-03 NOTE — Progress Notes (Signed)
11/03/2020 Jw Osifo Denton Brick [id number] [birth year]   HISTORY OF PRESENT ILLNESS:  This is a 50 year old male who is a patient Dr. Donneta Romberg.  He was seen by me last year and then underwent EGD and colonoscopy in April 2021 as follows:  EGD: No gross lesions in esophagus. Biopsied. Z-line regular, 40 cm from the incisors. - Gastritis. Biopsied. - Submucosal nodule found in the duodenum with positive pillow sign suggestive of lipoma. Tunnel biopsied. - No other gross lesions in the duodenal bulb, in the first portion of the duodenum and in the second portion of the duodenum.  Colonoscopy: - Hemorrhoids found on digital rectal exam. - The examined portion of the ileum was normal. - Stool in the entire examined colon. Lavaged copiously with adequate visualization. - Two 4 to 5 mm polyps in the transverse colon and in the ascending colon, removed with a cold snare. Resected and retrieved. - Normal mucosa in the entire examined colon. - Non-bleeding non-thrombosed internal hemorrhoids.  Pathology: 1. Surgical [P], gastric - CHRONIC ACTIVE GASTRITIS - H. PYLORI ORGANISMS PRESENT - NO INTESTINAL METAPLASIA IDENTIFIED - SEE COMMENT 2. Surgical [P], esophagus - BENIGN SQUAMOUS MUCOSA - NO INCREASED INTRAEPITHELIAL EOSINOPHILS 3. Surgical [P], duodenal nodule - BENIGN DUODENAL MUCOSA WITH INTRAEPITHELIAL LYMPHOCYTES - NO ACUTE INFLAMMATION OR VILLOUS BLUNTING IDENTIFIED 4. Surgical [P], colon, ascending, transverse, polyp (2) - TUBULAR ADENOMA (1 OF 2 FRAGMENTS) - BENIGN COLONIC MUCOSA (1 OF 2 FRAGMENTS) - NO HIGH GRADE DYSPLASIA OR MALIGNANCY IDENTIFIED  He completed his course of antibiotics for the H. pylori, but never returned for the stool antigen to confirm eradication.  CT of the abdomen and pelvis with contrast in May 2021 was unremarkable.  Dr. Rush Landmark recommended a repeat EGD with EUS in 4 to 6 months to reevaluate the duodenal nodule that was seen.  He is  here today to follow-up with those issues in conjunction with symptoms that he has been having for the past year.  He tells me that he really started noticing all of this in October of last year.  He says that he was treated for the H. pylori in May and then took Augmentin for a dental/tooth infection in September.  Then he began noticing the symptoms in October.  He says that he will have cycles of diarrhea or very loose/mushy stools.  He says that when the cycles occur he will wake up first thing in the morning bloated and very gassy.  He says that during these periods of time that last several days he can have anywhere from 3-5 bowel movements a day.  He says that the first couple are usually formed and then the subsequent stools are soft or mushy.  He says that this is also associated with nausea and dizziness.  He just feels unwell and does not feel like eating.  He actually had some episodes of hypoglycemia with a blood sugar down to 36 because he was not eating.  He says that at most he will usually go 5 to 7 days between these episodes where he feels fine has no symptoms at all, but if anything then more on the constipated side.  He says that he usually does not go more than a week without symptoms.  He cannot identify this as being dietary related.  He has been using Imodium with simethicone, which helps to control his symptoms.  He denies any mucus or blood in the stool.  No major dietary changes.   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 Rush Landmark   Gastroesophageal reflux disease without esophagitis 02/13/2017   H. pylori infection 04/2019   History of colon polyps 2021   Seasonal allergic rhinitis due to pollen 02/13/2017   Past Surgical History:  Procedure Laterality Date   EAR CANALOPLASTY     NOSE SURGERY      reports that he has never smoked. He has never used smokeless tobacco. He reports that he does not drink alcohol and does not use drugs. family  history includes Diabetes in his mother; Hypertension in his mother. Allergies  Allergen Reactions   Procaine Anaphylaxis   Tetanus Toxoids Anaphylaxis    Tetanus Serum      Outpatient Encounter Medications as of 11/03/2020  Medication Sig   cetirizine (ZYRTEC) 10 MG tablet Take 10 mg by mouth as needed.    fluticasone (FLONASE) 50 MCG/ACT nasal spray Place 2 sprays into both nostrils as needed.   Ibuprofen 200 MG CAPS Take by mouth.   LOPERAMIDE-SIMETHICONE PO Take by mouth as needed.   methocarbamol (ROBAXIN) 750 MG tablet Take 1 tablet (750 mg total) by mouth every 8 (eight) hours as needed for muscle spasms.   omeprazole (PRILOSEC) 20 MG capsule TAKE 1 CAPSULE BY MOUTH TWICE DAILY BEFORE A MEAL (Patient taking differently: Take 20 mg by mouth as needed.)   valACYclovir (VALTREX) 1000 MG tablet Take 2 tablets (2,000 mg total) by mouth 2 (two) times daily. x1 day and may repeat with future outbreaks   [DISCONTINUED] hydrOXYzine (ATARAX/VISTARIL) 25 MG tablet . TAKE ONE 25 MG TABLET 30-60 MINUTES PRIOR TO BEDTIME FOR INSOMNIA FOR ANXIETY YOU MAY INCREASE TO TWO TABLETS.   [DISCONTINUED] meclizine (ANTIVERT) 25 MG tablet Take 1 tablet (25 mg total) by mouth 3 (three) times daily as needed for dizziness.   [DISCONTINUED] tadalafil (CIALIS) 20 MG tablet TAKE 1 TABLET BY MOUTH ONCE A DAY AS NEEDED   No facility-administered encounter medications on file as of 11/03/2020.     REVIEW OF SYSTEMS  : All other systems reviewed and negative except where noted in the History of Present Illness.   PHYSICAL EXAM: BP 106/70   Pulse 75   Ht 5\' 11"  (1.803 m)   Wt 200 lb (90.7 kg) Comment: pt refused to be weighed  SpO2 96%   BMI 27.89 kg/m  General: Well developed AA male in no acute distress Head: Normocephalic and atraumatic Eyes:  Sclerae anicteric, conjunctiva pink. Ears: Normal auditory acuity Lungs: Clear throughout to auscultation; no W/R/R. Heart: Regular rate and rhythm; no  M/R/G. Abdomen: Soft, non-distended.  BS present.  Non-tender. Musculoskeletal: Symmetrical with no gross deformities  Skin: No lesions on visible extremities Extremities: No edema  Neurological: Alert oriented x 4, grossly non-focal Psychological:  Alert and cooperative. Normal mood and affect  ASSESSMENT AND PLAN: *Change in bowel habits with alternating constipation followed by several days of loose stools multiple times a day with associated nausea and abdominal bloating: This all began around October of last year.  He had taken antibiotics for H. pylori in May and then took Augmentin for a tooth infection in September.  Really noticed all of this following that.  Because he would feel poorly he would not eat and then suffered episodes of hypoglycemia.  He does not have something infectious, including C. difficile being that he has periods of time that he struggles with more constipation.  I wonder if he did throw off his gut  flora with antibiotics, however.  We discussed trying a probiotic.  Could be IBS, but he never had any issues in the past so would be strange for that to develop so suddenly.  EPI can have similar symptoms as IBS so we will check a pancreatic fecal elastase.  He is concerned about the nodule that was found in his duodenum previously and wants to make sure that is not some type of neuroendocrine tumor, which is certainly reasonable and EGD/EUS was recommended earlier this year.  We will proceed with that, to schedule with Dr. Rush Landmark.  Patient needs to have that done in January due to his work schedule.  We will contact him to schedule that when the January schedule becomes available.  Otherwise he can continue his Imodium with simethicone as needed.  We briefly discussed celiac disease, but duodenal biopsies did not show that on EGD last year.  If all of this proves negative question if he would benefit from a course of Xifaxan. *Duodenal nodule: EGD with biopsy and CT scan of  the abdomen/pelvis with contrast were okay.  Repeat EGD with EUS was recommended for earlier this year.  We will proceed with that as above. *History of H. pylori infection: Completed treatment in May 2021.  We will perform stool antigen to confirm eradication.  Advised he needs to be off PPI for 2 weeks prior to performing stool study.  Can take Pepcid in the interim if needed and only needs to be without that for 24 to 48 hours prior to the study.   CC:  Inda Coke, [place]

## 2020-11-03 NOTE — Patient Instructions (Addendum)
If you are age 52 or younger, your body mass index should be between 19-25. Your Body mass index is 27.89 kg/m. If this is out of the aformentioned range listed, please consider follow up with your Primary Care Provider.   The [facility] providers would like to encourage you to use [facility] to communicate with providers for non-urgent requests or questions.  Due to long hold times on the telephone, sending your provider a message by [facility] may be faster and more efficient way to get a response. Please allow 48 business hours for a response.  Please remember that this is for non-urgent requests/questions.  LABS:  Stool tests have been ordered for you today. Our lab is located in the basement. Press "B" on the elevator. The lab is located at the first door on the left as you exit the elevator.  HEALTHCARE LAWS AND MY CHART RESULTS: Due to recent changes in healthcare laws, you may see the results of your imaging and laboratory studies on MyChart before your provider has had a chance to review them.   We understand that in some cases there may be results that are confusing or concerning to you. Not all laboratory results come back in the same time frame and the provider may be waiting for multiple results in order to interpret others.  Please give [place] 48 hours in order for your provider to thoroughly review all the results before contacting the office for clarification of your results.   RECOMMENDATIONS: Take a Probiotic daily. STOP taking your PPI for 2 weeks prior to stool sample.  It was great seeing you today! Thank you for entrusting me with your care and choosing [facility].  Alonza Bogus, PA-C

## 2020-11-04 NOTE — Progress Notes (Signed)
Attending Physician's Attestation   I have reviewed the chart.   I agree with the Advanced Practitioner's note, impression, and recommendations with any updates as below.    Orvis Stann Mansouraty, MD [facility] Office # [phone]  

## 2020-12-24 ENCOUNTER — Other Ambulatory Visit: Payer: Self-pay

## 2020-12-24 ENCOUNTER — Telehealth: Payer: Self-pay

## 2020-12-24 DIAGNOSIS — K3189 Other diseases of stomach and duodenum: Secondary | ICD-10-CM

## 2020-12-24 NOTE — Telephone Encounter (Signed)
The pt has been scheduled for EGD EUS with GM at [facility] on 03/04/21. Several days offered but declined due to his and his wife's work schedule.  All information has been given to the pt and sent to My Chart.  He did confirm that he views his messages.

## 2020-12-24 NOTE — Telephone Encounter (Signed)
-----   Message from Timothy Lasso, RN sent at 11/03/2020  2:23 PM EDT -----  ----- Message ----- From: Loralie Champagne, PA-C Sent: 11/03/2020   2:22 PM EDT To: Timothy Lasso, RN  Needs EGD and EUS in January either 9th or 10th or the week of the 18th through the 24th with Dr. Rush Landmark.

## 2021-02-24 ENCOUNTER — Encounter (HOSPITAL_COMMUNITY): Payer: Self-pay | Admitting: Gastroenterology

## 2021-03-03 NOTE — Anesthesia Preprocedure Evaluation (Addendum)
Anesthesia Evaluation  Patient identified by MRN, date of birth, ID band Patient awake    Reviewed: Allergy & Precautions, NPO status , Patient's Chart, lab work & pertinent test results  Airway Mallampati: I  TM Distance: >3 FB Neck ROM: Full    Dental no notable dental hx. (+) Caps, Dental Advisory Given   Pulmonary neg pulmonary ROS,    Pulmonary exam normal breath sounds clear to auscultation       Cardiovascular negative cardio ROS Normal cardiovascular exam Rhythm:Regular Rate:Normal     Neuro/Psych negative neurological ROS  negative psych ROS   GI/Hepatic Neg liver ROS, GERD  Medicated,Duodenal polyp   Endo/Other  negative endocrine ROS  Renal/GU negative Renal ROS     Musculoskeletal negative musculoskeletal ROS (+)   Abdominal   Peds  Hematology negative hematology ROS (+)   Anesthesia Other Findings   Reproductive/Obstetrics                            Anesthesia Physical Anesthesia Plan  ASA: 2  Anesthesia Plan: MAC   Post-op Pain Management:    Induction: Intravenous  PONV Risk Score and Plan: 1 and Propofol infusion and Treatment may vary due to age or medical condition  Airway Management Planned: Natural Airway and Simple Face Mask  Additional Equipment:   Intra-op Plan:   Post-operative Plan:   Informed Consent: I have reviewed the patients History and Physical, chart, labs and discussed the procedure including the risks, benefits and alternatives for the proposed anesthesia with the patient or authorized representative who has indicated his/her understanding and acceptance.     Dental advisory given  Plan Discussed with: CRNA and Anesthesiologist  Anesthesia Plan Comments:        Anesthesia Quick Evaluation

## 2021-03-04 ENCOUNTER — Ambulatory Visit (HOSPITAL_COMMUNITY): Payer: 59 | Admitting: Anesthesiology

## 2021-03-04 ENCOUNTER — Ambulatory Visit (HOSPITAL_COMMUNITY)
Admission: RE | Admit: 2021-03-04 | Discharge: 2021-03-04 | Disposition: A | Discharge status: Home / Self Care | Payer: 59 | Source: Ambulatory Visit | Attending: Gastroenterology | Admitting: Gastroenterology

## 2021-03-04 ENCOUNTER — Ambulatory Visit (HOSPITAL_BASED_OUTPATIENT_CLINIC_OR_DEPARTMENT_OTHER): Payer: 59 | Admitting: Anesthesiology

## 2021-03-04 ENCOUNTER — Encounter (HOSPITAL_COMMUNITY)
Admission: RE | Disposition: A | Discharge status: Home / Self Care | Payer: Self-pay | Source: Ambulatory Visit | Attending: Gastroenterology

## 2021-03-04 ENCOUNTER — Encounter (HOSPITAL_COMMUNITY): Payer: Self-pay | Admitting: Gastroenterology

## 2021-03-04 ENCOUNTER — Other Ambulatory Visit: Payer: Self-pay

## 2021-03-04 DIAGNOSIS — K2950 Unspecified chronic gastritis without bleeding: Secondary | ICD-10-CM | POA: Insufficient documentation

## 2021-03-04 DIAGNOSIS — K319 Disease of stomach and duodenum, unspecified: Secondary | ICD-10-CM | POA: Diagnosis not present

## 2021-03-04 DIAGNOSIS — K3189 Other diseases of stomach and duodenum: Secondary | ICD-10-CM | POA: Diagnosis not present

## 2021-03-04 DIAGNOSIS — K317 Polyp of stomach and duodenum: Secondary | ICD-10-CM

## 2021-03-04 DIAGNOSIS — I899 Noninfective disorder of lymphatic vessels and lymph nodes, unspecified: Secondary | ICD-10-CM | POA: Diagnosis not present

## 2021-03-04 DIAGNOSIS — K31A0 Gastric intestinal metaplasia, unspecified: Secondary | ICD-10-CM | POA: Diagnosis not present

## 2021-03-04 DIAGNOSIS — K869 Disease of pancreas, unspecified: Secondary | ICD-10-CM

## 2021-03-04 DIAGNOSIS — K219 Gastro-esophageal reflux disease without esophagitis: Secondary | ICD-10-CM | POA: Diagnosis not present

## 2021-03-04 DIAGNOSIS — Z09 Encounter for follow-up examination after completed treatment for conditions other than malignant neoplasm: Secondary | ICD-10-CM | POA: Insufficient documentation

## 2021-03-04 DIAGNOSIS — K900 Celiac disease: Secondary | ICD-10-CM | POA: Diagnosis not present

## 2021-03-04 DIAGNOSIS — K929 Disease of digestive system, unspecified: Secondary | ICD-10-CM

## 2021-03-04 DIAGNOSIS — K31A19 Gastric intestinal metaplasia without dysplasia, unspecified site: Secondary | ICD-10-CM | POA: Diagnosis not present

## 2021-03-04 HISTORY — PX: BIOPSY: SHX5522

## 2021-03-04 HISTORY — PX: ESOPHAGOGASTRODUODENOSCOPY (EGD) WITH PROPOFOL: SHX5813

## 2021-03-04 HISTORY — PX: EUS: SHX5427

## 2021-03-04 SURGERY — ESOPHAGOGASTRODUODENOSCOPY (EGD) WITH PROPOFOL
Anesthesia: Monitor Anesthesia Care

## 2021-03-04 MED ORDER — PROPOFOL 10 MG/ML IV BOLUS
INTRAVENOUS | Status: DC | PRN
  Administered 2021-03-04: 30 mg via INTRAVENOUS
  Administered 2021-03-04: 20 mg via INTRAVENOUS
  Administered 2021-03-04: 30 mg via INTRAVENOUS
  Administered 2021-03-04: 20 mg via INTRAVENOUS

## 2021-03-04 MED ORDER — LACTATED RINGERS IV SOLN: INTRAVENOUS | Status: DC | PRN

## 2021-03-04 MED ORDER — SODIUM CHLORIDE 0.9 % IV SOLN: INTRAVENOUS | Status: DC

## 2021-03-04 MED ORDER — PROPOFOL 500 MG/50ML IV EMUL
INTRAVENOUS | Status: DC | PRN
  Administered 2021-03-04: 100 ug/kg/min via INTRAVENOUS

## 2021-03-04 SURGICAL SUPPLY — 15 items

## 2021-03-04 NOTE — Anesthesia Procedure Notes (Signed)
Procedure Name: MAC Date/Time: 03/04/2021 7:49 AM Performed by: Jenne Campus, CRNA Pre-anesthesia Checklist: Patient identified, Emergency Drugs available, Suction available and Patient being monitored Oxygen Delivery Method: Simple face mask

## 2021-03-04 NOTE — Transfer of Care (Signed)
Immediate Anesthesia Transfer of Care Note  Patient: Shawn Franco  Procedure(s) Performed: ESOPHAGOGASTRODUODENOSCOPY (EGD) WITH PROPOFOL UPPER ENDOSCOPIC ULTRASOUND (EUS) RADIAL BIOPSY  Patient Location: PACU  Anesthesia Type:MAC  Level of Consciousness: oriented, drowsy and patient cooperative  Airway & Oxygen Therapy: Patient Spontanous Breathing and Patient connected to face mask oxygen  Post-op Assessment: Report given to RN and Post -op Vital signs reviewed and stable  Post vital signs: Reviewed  Last Vitals:  Vitals Value Taken Time  BP 90/57 03/04/21 0850  Temp    Pulse 72 03/04/21 0852  Resp 16 03/04/21 0852  SpO2 99 % 03/04/21 0852  Vitals shown include unvalidated device data.  Last Pain:  Vitals:   03/04/21 0704  TempSrc: Temporal  PainSc: 0-No pain         Complications: No notable events documented.

## 2021-03-04 NOTE — Op Note (Signed)
[facility] Patient Name: Shawn Franco Procedure Date : 03/04/2021 MRN: [id number] Attending MD: Justice Britain , MD Date of Birth: 03-Jul-1970 CSN: [id number] Age: 51 Admit Type: Outpatient Procedure:                Upper EUS Indications:              Duodenal mucosal mass/polyp found on endoscopy,                            Submucosal tumor versus extrinsic mass found on                            endoscopy in duodenum previously, Previously                            treated for Helicobacter pylori, Diarrhea Providers:                Justice Britain, MD, Burtis Junes, RN, Cletis Athens,                            Technician, Luciana Axe, CRNA Referring MD:             Inda Coke, Alonza Bogus PA, [place] Medicines:                Monitored Anesthesia Care Complications:            No immediate complications. Estimated Blood Loss:     Estimated blood loss was minimal. Procedure:                Pre-Anesthesia Assessment:                           - Prior to the procedure, a History and Physical                            was performed, and patient medications and                            allergies were reviewed. The patient's tolerance of                            previous anesthesia was also reviewed. The risks                            and benefits of the procedure and the sedation                            options and risks were discussed with the patient.                            All questions were answered, and informed consent                            was obtained. Prior Anticoagulants: The patient has  taken no previous anticoagulant or antiplatelet                            agents. ASA Grade Assessment: II - A patient with                            mild systemic disease. After reviewing the risks                            and benefits, the patient was deemed in                            satisfactory condition to  undergo the procedure.                           After obtaining informed consent, the endoscope was                            passed under direct vision. Throughout the                            procedure, the patient's blood pressure, pulse, and                            oxygen saturations were monitored continuously. The                            [id number]-[id number] ([id number]) Olympus endoscope was introduced                            through the mouth, and advanced to the third part                            of duodenum. The [id number]-[id number] ([id number]) Olympus                            duodenoscope was introduced through the mouth, and                            advanced to the area of papilla. The [id number]                            ([id number]) Olympus Linear EUS scope was introduced                            through the mouth, and advanced to the duodenum for                            ultrasound examination from the stomach and                            duodenum. The [id number]-AL5 ([id number]) Olympus radial  ultrasound scope was introduced through the mouth,                            and advanced to the duodenum for ultrasound                            examination. The upper EUS was accomplished without                            difficulty. The patient tolerated the procedure. Scope In: Scope Out: Findings:      ENDOSCOPIC FINDING: :      No gross lesions were noted in the entire esophagus.      The Z-line was regular and was found 39 cm from the incisors.      Patchy mildly erythematous mucosa without bleeding was found in the       gastric antrum.      No other gross lesions were noted in the entire examined stomach.       Biopsies were taken with a cold forceps for histology and Helicobacter       pylori testing.      Prominence of the minor papilla.      Prominence of the major papilla.      Normal mucosa was found in the duodenal bulb, in the first portion of        the duodenum and in the second portion of the duodenum. Biopsies for       histology were taken with a cold forceps for evaluation of celiac       disease and enteropathy rule out.      ENDOSONOGRAPHIC FINDING: :      Endosonographic imaging of the major papilla showed no intramural       (subepithelial) lesion.      Endosonographic imaging of the minor papilla showed no intramural       (subepithelial) lesion.      Pancreatic parenchymal abnormalities were noted in the entire pancreas.       These consisted of lobularity without honeycombing.      The pancreatic duct had a normal endosonographic appearance in the       pancreatic head (PD - 0.7 mm), uncinate process (PD - 0.5 mm), genu (PD       - 1.0 mm), body (PD - 0.7 mm), and tail (PD - 0.6 mm).      There was no sign of significant endosonographic abnormality in the       common bile duct (2.4 mm -> 2.6 mm) and in the common hepatic duct (3.5       mm). An unremarkable gallbladder, no stones, no biliary sludge and ducts       of normal caliber were identified.      Endosonographic imaging in the visualized portion of the liver showed no       mass.      No malignant-appearing lymph nodes were visualized in the celiac region       (level 20), perigastric region, peripancreatic region and porta hepatis       region.      The region of the celiac plexus and celiac ganglia was visualized. Impression:               EGD Impression:                           -  No gross lesions in esophagus. Z-line regular, 39                            cm from the incisors.                           - Erythematous mucosa in the antrum. No other gross                            lesions in the stomach. Biopsied to rule out                            persistent HP infection.                           - Prominence of the major and minor papilla.                           - Normal mucosa was found in the duodenal bulb, in                             the first portion of the duodenum and in the second                            portion of the duodenum. Biopsied.                           EUS Impression:                           - Pancreatic parenchymal abnormalities consisting                            of lobularity were noted in the entire pancreas.                            These changes alone do not constitute chronic                            pancreatitis.                           - The pancreatic duct had a normal endosonographic                            appearance in the pancreatic head, uncinate process                            of the pancreas, genu of the pancreas, body of the                            pancreas and tail of the pancreas.                           -  Endosonographic imaging of the minor and major                            papillas showed no intramural (subepithelial)                            lesion.                           - There was no sign of significant pathology in the                            common bile duct and in the common hepatic duct.                           - No malignant-appearing lymph nodes were                            visualized in the celiac region (level 20),                            perigastric region, peripancreatic region and porta                            hepatis region. Recommendation:           - The patient will be observed post-procedure,                            until all discharge criteria are met.                           - Discharge patient to home.                           - Patient has a contact number available for                            emergencies. The signs and symptoms of potential                            delayed complications were discussed with the                            patient. Return to normal activities tomorrow.                            Written discharge instructions were provided to the                            patient.                            - Resume previous diet.                           -  Observe patient's clinical course.                           - Continue present medications.                           - Await path results.                           - If symptoms recur consider EPI evaluation and                            SIBO breath testing vs IBS-D empiric treatment.                           - The findings and recommendations were discussed                            with the patient.                           - The findings and recommendations were discussed                            with the patient's family. Procedure Code(s):        --- Professional ---                           [phone], Esophagogastroduodenoscopy, flexible,                            transoral; with endoscopic ultrasound examination                            limited to the esophagus, stomach or duodenum, and                            adjacent structures                           43239, Esophagogastroduodenoscopy, flexible,                            transoral; with biopsy, single or multiple Diagnosis Code(s):        --- Professional ---                           K31.89, Other diseases of stomach and duodenum                           K86.9, Disease of pancreas, unspecified                           I89.9, Noninfective disorder of lymphatic vessels                            and lymph nodes, unspecified  K92.9, Disease of digestive system, unspecified                           R19.7, Diarrhea, unspecified CPT copyright 2019 [employer]. All rights reserved. The codes documented in this report are preliminary and upon coder review may  be revised to meet current compliance requirements. Justice Britain, MD 03/04/2021 8:54:40 AM Number of Addenda: 0

## 2021-03-04 NOTE — Anesthesia Postprocedure Evaluation (Signed)
Anesthesia Post Note  Patient: Shawn Franco  Procedure(s) Performed: ESOPHAGOGASTRODUODENOSCOPY (EGD) WITH PROPOFOL UPPER ENDOSCOPIC ULTRASOUND (EUS) RADIAL BIOPSY     Patient location during evaluation: PACU Anesthesia Type: MAC Level of consciousness: awake and alert and oriented Pain management: pain level controlled Vital Signs Assessment: post-procedure vital signs reviewed and stable Respiratory status: spontaneous breathing, nonlabored ventilation and respiratory function stable Cardiovascular status: stable and blood pressure returned to baseline Postop Assessment: no apparent nausea or vomiting Anesthetic complications: no   No notable events documented.  Last Vitals:  Vitals:   03/04/21 0704 03/04/21 0850  BP: 127/77 (!) 90/57  Pulse: (!) 57 74  Resp: 15 17  Temp: (!) 36.1 C 36.7 C  SpO2: 99% 100%    Last Pain:  Vitals:   03/04/21 0850  TempSrc:   PainSc: Asleep                 Ciria Bernardini A.

## 2021-03-04 NOTE — H&P (Addendum)
GASTROENTEROLOGY PROCEDURE H&P NOTE   Primary Care Physician: Inda Coke, PA  HPI: Nihal Anthoni Geerts is a 51 y.o. 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 Rush Landmark   Gastroesophageal reflux disease without esophagitis 02/13/2017   H. pylori infection 04/2019   History of colon polyps 2021   Seasonal allergic rhinitis due to pollen 02/13/2017   Past Surgical History:  Procedure Laterality Date   EAR CANALOPLASTY     NOSE SURGERY     Current Facility-Administered Medications  Medication Dose Route Frequency Provider Last Rate Last Admin   0.9 %  sodium chloride infusion   Intravenous Continuous Mansouraty, Telford Nab., MD        Current Facility-Administered Medications:    0.9 %  sodium chloride infusion, , Intravenous, Continuous, Mansouraty, Telford Nab., MD Allergies  Allergen Reactions   Procaine Anaphylaxis   Tetanus Toxoids Anaphylaxis    Tetanus Serum   Family History  Problem Relation Age of Onset   Diabetes Mother    Hypertension Mother    Colon cancer Neg Hx    Esophageal cancer Neg Hx    Stomach cancer Neg Hx    Social History   Socioeconomic History   Marital status: Married    Spouse name: Not on file   Number of children: Not on file   Years of education: Not on file   Highest education level: Not on file  Occupational History   Not on file  Tobacco Use   Smoking status: Never   Smokeless tobacco: Never  Vaping Use   Vaping Use: Never used  Substance and Sexual Activity   Alcohol use: Never   Drug use: Never   Sexual activity: Yes  Other Topics Concern   Not on file  Social History Narrative   Twin 53 yo and 51 yo - as of 2021   Social Determinants of Health   Financial Resource Strain: Not on file  Food Insecurity: Not on file  Transportation Needs: Not on file  Physical Activity: Not on  file  Stress: Not on file  Social Connections: Not on file  Intimate Partner Violence: Not on file    Physical Exam: Today's Vitals   03/04/21 0704  BP: 127/77  Pulse: (!) 57  Resp: 15  Temp: (!) 97 F (36.1 C)  TempSrc: Temporal  SpO2: 99%  PainSc: 0-No pain   There is no height or weight on file to calculate BMI. GEN: NAD EYE: Sclerae anicteric ENT: MMM CV: Non-tachycardic GI: Soft, NT/ND NEURO:  Alert & Oriented x 3  Lab Results: No results for input(s): WBC, HGB, HCT, PLT in the last 72 hours. BMET No results for input(s): NA, K, CL, CO2, GLUCOSE, BUN, CREATININE, CALCIUM in the last 72 hours. LFT No results for input(s): PROT, ALBUMIN, AST, ALT, ALKPHOS, BILITOT, BILIDIR, IBILI in the last 72 hours. PT/INR No results for input(s): LABPROT, INR in the last 72 hours.   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  The risks of an EUS including intestinal perforation, bleeding, infection, aspiration, and medication effects were discussed as was the possibility it may not give a definitive diagnosis if a biopsy is performed.  When a biopsy of the pancreas is done as part of the EUS, there is  an additional risk of pancreatitis at the rate of about 1-2%.  It was explained that procedure related pancreatitis is typically mild, although it can be severe and even life threatening, which is why we do not perform random pancreatic biopsies and only biopsy a lesion/area we feel is concerning enough to warrant the risk.  The risks and benefits of endoscopic evaluation/treatment were discussed with the patient and/or family; these include but are not limited to the risk of perforation, infection, bleeding, missed lesions, lack of diagnosis, severe illness requiring hospitalization, as well as anesthesia and sedation related illnesses.  The patient's history has been reviewed, patient  examined, no change in status, and deemed stable for procedure.  The patient and/or family is agreeable to proceed.    Justice Britain, MD [facility] Office # [phone]

## 2021-03-08 ENCOUNTER — Other Ambulatory Visit: Payer: Self-pay

## 2021-03-08 ENCOUNTER — Encounter: Payer: Self-pay | Admitting: Gastroenterology

## 2021-03-08 DIAGNOSIS — R140 Abdominal distension (gaseous): Secondary | ICD-10-CM

## 2021-03-08 DIAGNOSIS — R195 Other fecal abnormalities: Secondary | ICD-10-CM

## 2021-03-08 DIAGNOSIS — R194 Change in bowel habit: Secondary | ICD-10-CM

## 2021-03-09 DIAGNOSIS — Z20828 Contact with and (suspected) exposure to other viral communicable diseases: Secondary | ICD-10-CM | POA: Diagnosis not present

## 2021-03-12 LAB — SURGICAL PATHOLOGY

## 2021-04-20 LAB — BASIC METABOLIC PANEL
BUN: 13 (ref 4–21)
CO2: 26 — AB (ref 13–22)
Chloride: 103 (ref 99–108)
Creatinine: 1.2 (ref 0.6–1.3)
Glucose: 97
Potassium: 4.1 mEq/L (ref 3.5–5.1)
Sodium: 144 (ref 137–147)

## 2021-04-20 LAB — LIPID PANEL
Cholesterol: 150 (ref 0–200)
HDL: 45 (ref 35–70)
LDL Cholesterol: 94
Triglycerides: 50 (ref 40–160)

## 2021-04-20 LAB — CBC: RBC: 4.68 (ref 3.87–5.11)

## 2021-04-20 LAB — HEPATIC FUNCTION PANEL
ALT: 25 U/L (ref 10–40)
AST: 26 (ref 14–40)
Alkaline Phosphatase: 48 (ref 25–125)
Bilirubin, Total: 0.5

## 2021-04-20 LAB — COMPREHENSIVE METABOLIC PANEL
Albumin: 4.6 (ref 3.5–5.0)
Calcium: 9.6 (ref 8.7–10.7)
Globulin: 2.4
eGFR: 72

## 2021-04-20 LAB — CBC AND DIFFERENTIAL
HCT: 40 — AB (ref 41–53)
Hemoglobin: 12.9 — AB (ref 13.5–17.5)
Neutrophils Absolute: 1.4
Platelets: 212 10*3/uL (ref 150–400)
WBC: 3.6

## 2021-04-20 LAB — TSH: TSH: 2.34 (ref 0.41–5.90)

## 2021-04-20 LAB — HEMOGLOBIN A1C: Hemoglobin A1C: 5

## 2021-05-11 ENCOUNTER — Other Ambulatory Visit (HOSPITAL_COMMUNITY): Payer: Self-pay

## 2021-05-11 MED ORDER — OMEPRAZOLE 20 MG PO CPDR
20.0000 mg | DELAYED_RELEASE_CAPSULE | Freq: Every day | ORAL | 3 refills | Status: DC
  Filled 2021-05-11 – 2021-05-27 (×2): qty 90, 90d supply, fill #0
  Filled 2021-10-04: qty 90, 90d supply, fill #1
  Filled 2022-04-28: qty 90, 90d supply, fill #2

## 2021-05-11 NOTE — Telephone Encounter (Signed)
[place], ?Please send in prescription as outlined.  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 ?Please make sure that he has an EGD recall for the next 6 to 12 months for gastric intestinal mapping.  I am happy to do it before the end of the year or we can do it next year.  I would just touch base with him and see when he would like to have that done. ?His EGD can be done upstairs in the [place]. ?Thanks. ?GM ?

## 2021-05-11 NOTE — Telephone Encounter (Addendum)
Recommendations sent to patient via MyChart. ?Omeprazole RX sent in per pt request. ?

## 2021-05-19 ENCOUNTER — Other Ambulatory Visit (HOSPITAL_COMMUNITY): Payer: Self-pay

## 2021-05-27 ENCOUNTER — Other Ambulatory Visit (HOSPITAL_COMMUNITY): Payer: Self-pay

## 2021-05-27 DIAGNOSIS — Z30433 Encounter for removal and reinsertion of intrauterine contraceptive device: Secondary | ICD-10-CM | POA: Diagnosis not present

## 2021-05-27 DIAGNOSIS — Z1231 Encounter for screening mammogram for malignant neoplasm of breast: Secondary | ICD-10-CM | POA: Diagnosis not present

## 2021-05-27 DIAGNOSIS — Z01419 Encounter for gynecological examination (general) (routine) without abnormal findings: Secondary | ICD-10-CM | POA: Diagnosis not present

## 2021-08-23 ENCOUNTER — Encounter: Payer: Self-pay | Admitting: Physician Assistant

## 2021-08-23 ENCOUNTER — Ambulatory Visit (INDEPENDENT_AMBULATORY_CARE_PROVIDER_SITE_OTHER): Payer: 59 | Admitting: Physician Assistant

## 2021-08-23 VITALS — BP 130/80 | HR 51 | Temp 97.7°F | Ht 71.0 in | Wt 205.2 lb

## 2021-08-23 DIAGNOSIS — R42 Dizziness and giddiness: Secondary | ICD-10-CM

## 2021-08-23 DIAGNOSIS — K219 Gastro-esophageal reflux disease without esophagitis: Secondary | ICD-10-CM | POA: Diagnosis not present

## 2021-08-23 DIAGNOSIS — E663 Overweight: Secondary | ICD-10-CM | POA: Diagnosis not present

## 2021-08-23 DIAGNOSIS — Z0000 Encounter for general adult medical examination without abnormal findings: Secondary | ICD-10-CM

## 2021-08-23 DIAGNOSIS — R5382 Chronic fatigue, unspecified: Secondary | ICD-10-CM | POA: Insufficient documentation

## 2021-08-23 NOTE — Patient Instructions (Addendum)
It was great to see you!  Keep up the good work!  Look into F3!!! I think it would be great for you!  Take care,  Aldona Bar

## 2021-08-23 NOTE — Progress Notes (Signed)
Subjective:    Shawn Franco is a 51 y.o. male and is here for a comprehensive physical exam.  HPI  Health Maintenance Due  Topic Date Due   HIV Screening  Never done   Hepatitis C Screening  Never done   INFLUENZA VACCINE  08/17/2021   Acute Concerns: None  Chronic Issues: Dizziness  Patient has had issue with dizziness in the past. Does admit having occasional dizzy spells. However, his symptoms usually resolved after eating snacks. .  States his symptoms are manageable. Denies any worsening sx at this time.   GERD Patient is currently compliant with taking Prilosec 20 mg daily with no complications. He is tolerating this well and denies any adverse side effects.   Health Maintenance: Immunizations -- UTD; he is going to pursue Shingrix at age 28 Colonoscopy -- UTD PSA -- 0.4 on this years [facility] labs Lab Results  Component Value Date   PSA 0.36 03/25/2019   PSA 0.36 02/13/2017   Diet -- Balanced diet.  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  Weight -- 205 Ib (93.1 kg) Weight history Wt Readings from Last 10 Encounters:  08/23/21 205 lb 4 oz (93.1 kg)  11/03/20 200 lb (90.7 kg)  11/28/19 207 lb (93.9 kg)  11/19/19 207 lb 4 oz (94 kg)  10/15/19 211 lb 6.4 oz (95.9 kg)  05/16/19 212 lb (96.2 kg)  03/26/19 212 lb (96.2 kg)  03/25/19 213 lb 8 oz (96.8 kg)  02/13/17 204 lb 6.4 oz (92.7 kg)   Body mass index is 28.63 kg/m. Mood -- Stable Tobacco use -- None  Tobacco Use: Low Risk  (08/23/2021)   Patient History    Smoking Tobacco Use: Never    Smokeless Tobacco Use: Never    Passive Exposure: Not on file    Alcohol use ---  reports no history of alcohol use.      08/23/2021   11:05 AM  Depression screen PHQ 2/9  Decreased Interest 0  Down, Depressed, Hopeless 0  PHQ - 2 Score 0     Other providers/specialists: Patient Care Team: Inda Coke, [place] as PCP - General (Physician Assistant)   PMHx, SurgHx,  SocialHx, Medications, and Allergies were reviewed in the Visit Navigator and updated as appropriate.   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 Rush Landmark   Gastroesophageal reflux disease without esophagitis 02/13/2017   H. pylori infection 04/2019   History of colon polyps 2021   Seasonal allergic rhinitis due to pollen 02/13/2017     Past Surgical History:  Procedure Laterality Date   BIOPSY  03/04/2021   Procedure: BIOPSY;  Surgeon: Irving Copas., MD;  Location[facility] ENDOSCOPY;  Service: Gastroenterology;;   Leavy Cella CANALOPLASTY     ESOPHAGOGASTRODUODENOSCOPY (EGD) WITH PROPOFOL N/A 03/04/2021   Procedure: ESOPHAGOGASTRODUODENOSCOPY (EGD) WITH PROPOFOL;  Surgeon: Irving Copas., MD;  Location: [facility];  Service: Gastroenterology;  Laterality: N/A;   EUS N/A 03/04/2021   Procedure: UPPER ENDOSCOPIC ULTRASOUND (EUS) RADIAL;  Surgeon: Irving Copas., MD;  Location: [facility];  Service: Gastroenterology;  Laterality: N/A;   NOSE SURGERY       Family History  Problem Relation Age of Onset   Diabetes Mother    Hypertension Mother    Colon cancer Neg Hx    Esophageal cancer Neg Hx    Stomach cancer Neg Hx     Social History   Tobacco  Use   Smoking status: Never   Smokeless tobacco: Never  Vaping Use   Vaping Use: Never used  Substance Use Topics   Alcohol use: Never   Drug use: Never    Review of Systems:   Review of Systems  Constitutional:  Negative for chills, fever, malaise/fatigue and weight loss.  HENT:  Negative for hearing loss, sinus pain and sore throat.   Respiratory:  Negative for cough and hemoptysis.   Cardiovascular:  Negative for chest pain, palpitations, leg swelling and PND.  Gastrointestinal:  Negative for abdominal pain, constipation, diarrhea, heartburn, nausea and vomiting.  Genitourinary:  Negative for dysuria, frequency and urgency.  Musculoskeletal:  Negative for back pain, myalgias  and neck pain.  Skin:  Negative for itching and rash.  Neurological:  Negative for dizziness, tingling, seizures and headaches.  Endo/Heme/Allergies:  Negative for polydipsia.  Psychiatric/Behavioral:  Negative for depression. The patient is not nervous/anxious.     Objective:   Vitals:   08/23/21 1105  BP: 130/80  Pulse: (!) 48  Temp: 97.7 F (36.5 C)  SpO2: 97%   Body mass index is 28.63 kg/m.  General Appearance:  Alert, cooperative, no distress, appears stated age  Head:  Normocephalic, without obvious abnormality, atraumatic  Eyes:  PERRL, conjunctiva/corneas clear, EOM's intact, fundi benign, both eyes       Ears:  Normal R TM and external ear canals L TM with chronic perforation present -- no surround erythema or obvious discharge  Nose: Nares normal, septum midline, mucosa normal, no drainage    or sinus tenderness  Throat: Lips, mucosa, and tongue normal; teeth and gums normal  Neck: Supple, symmetrical, trachea midline, no adenopathy; thyroid:  No enlargement/tenderness/nodules; no carotit bruit or JVD  Back:   Symmetric, no curvature, ROM normal, no CVA tenderness  Lungs:   Clear to auscultation bilaterally, respirations unlabored  Chest wall:  No tenderness or deformity  Heart:  Regular rate and rhythm, S1 and S2 normal, no murmur, rub   or gallop  Abdomen:   Soft, non-tender, bowel sounds active all four quadrants, no masses, no organomegaly  Extremities: Extremities normal, atraumatic, no cyanosis or edema  Prostate: Not done.   Skin: Skin color, texture, turgor normal, no rashes or lesions  Lymph nodes: Cervical, supraclavicular, and axillary nodes normal  Neurologic: CNII-XII grossly intact. Normal strength, sensation and reflexes throughout    Assessment/Plan:   Routine physical examination Today patient counseled on age appropriate routine health concerns for screening and prevention, each reviewed and up to date or declined. Immunizations reviewed and up  to date or declined. Labs ordered and reviewed. Risk factors for depression reviewed and negative. Hearing function and visual acuity are intact. ADLs screened and addressed as needed. Functional ability and level of safety reviewed and appropriate. Education, counseling and referrals performed based on assessed risks today. Patient provided with a copy of personalized plan for preventive services.  Dizziness No red flags He is overall able to manage his symptoms We did discuss consideration of CGM such as FreeStyle Libre if becomes more bothersome Continue to monitor, follow-up prn  Chronic GERD No red flags Stable Continue prilosec 20 mg daily and GI follow-up prn  Overweight Discussed trying to find more regular exercise that he enjoys  Patient Counseling: '[x]'$   Nutrition: Stressed importance of moderation in sodium/caffeine intake, saturated fat and cholesterol, caloric balance, sufficient intake of fresh fruits, vegetables, and fiber.  '[x]'$   Stressed the importance of regular exercise.   '[]'$   Substance Abuse: Discussed cessation/primary prevention of tobacco, alcohol, or other drug use; driving or other dangerous activities under the influence; availability of treatment for abuse.   '[x]'$   Injury prevention: Discussed safety belts, safety helmets, smoke detector, smoking near bedding or upholstery.   '[]'$   Sexuality: Discussed sexually transmitted diseases, partner selection, use of condoms, avoidance of unintended pregnancy  and contraceptive alternatives.   '[x]'$   Dental health: Discussed importance of regular tooth brushing, flossing, and dental visits.  '[x]'$   Health maintenance and immunizations reviewed. Please refer to Health maintenance section.     I,Savera Zaman,acting as a Education administrator for [employer], PA.,have documented all relevant documentation on the behalf of Inda Coke, PA,as directed by  Inda Coke, PA while in the presence of Inda Coke, [place].   I, Inda Coke, [place], have reviewed all documentation for this visit. The documentation on 08/23/21 for the exam, diagnosis, procedures, and orders are all accurate and complete.  Inda Coke, PA-C [place]

## 2021-08-24 ENCOUNTER — Encounter: Payer: Self-pay | Admitting: Physician Assistant

## 2021-08-24 ENCOUNTER — Other Ambulatory Visit: Payer: Self-pay | Admitting: Physician Assistant

## 2021-08-24 ENCOUNTER — Other Ambulatory Visit (HOSPITAL_COMMUNITY): Payer: Self-pay

## 2021-08-24 DIAGNOSIS — B009 Herpesviral infection, unspecified: Secondary | ICD-10-CM

## 2021-08-24 MED ORDER — METHOCARBAMOL 750 MG PO TABS
750.0000 mg | ORAL_TABLET | Freq: Three times a day (TID) | ORAL | 3 refills | Status: DC | PRN
  Filled 2021-08-24 – 2021-09-06 (×2): qty 90, 30d supply, fill #0
  Filled 2021-10-05: qty 90, 30d supply, fill #1
  Filled 2022-04-28 – 2022-05-26 (×2): qty 90, 30d supply, fill #2

## 2021-08-24 NOTE — Telephone Encounter (Signed)
Pt requesting refill for Methocarbamol 750 mg TID, 90 day supply. Has not been filled by you.

## 2021-08-25 ENCOUNTER — Other Ambulatory Visit (HOSPITAL_COMMUNITY): Payer: Self-pay

## 2021-08-25 MED ORDER — VALACYCLOVIR HCL 1 G PO TABS
2000.0000 mg | ORAL_TABLET | Freq: Two times a day (BID) | ORAL | 2 refills | Status: DC
  Filled 2021-08-25 – 2021-10-04 (×2): qty 30, 8d supply, fill #0
  Filled 2022-05-26: qty 30, 8d supply, fill #1

## 2021-09-06 ENCOUNTER — Other Ambulatory Visit (HOSPITAL_COMMUNITY): Payer: Self-pay

## 2021-09-21 ENCOUNTER — Encounter: Payer: Self-pay | Admitting: Physician Assistant

## 2021-10-04 ENCOUNTER — Other Ambulatory Visit (HOSPITAL_COMMUNITY): Payer: Self-pay

## 2021-10-05 ENCOUNTER — Other Ambulatory Visit (HOSPITAL_COMMUNITY): Payer: Self-pay

## 2021-10-11 ENCOUNTER — Encounter: Payer: Self-pay | Admitting: *Deleted

## 2021-12-30 ENCOUNTER — Encounter: Payer: Self-pay | Admitting: *Deleted

## 2022-03-28 ENCOUNTER — Emergency Department (HOSPITAL_COMMUNITY)
Admission: EM | Admit: 2022-03-28 | Discharge: 2022-03-28 | Disposition: A | Discharge status: Home / Self Care | Payer: 59 | Attending: Emergency Medicine | Admitting: Emergency Medicine

## 2022-03-28 ENCOUNTER — Other Ambulatory Visit: Payer: Self-pay

## 2022-03-28 ENCOUNTER — Encounter (HOSPITAL_COMMUNITY): Payer: Self-pay

## 2022-03-28 ENCOUNTER — Emergency Department (HOSPITAL_COMMUNITY): Payer: 59

## 2022-03-28 DIAGNOSIS — R200 Anesthesia of skin: Secondary | ICD-10-CM | POA: Insufficient documentation

## 2022-03-28 DIAGNOSIS — R29810 Facial weakness: Secondary | ICD-10-CM | POA: Diagnosis not present

## 2022-03-28 DIAGNOSIS — R9431 Abnormal electrocardiogram [ECG] [EKG]: Secondary | ICD-10-CM | POA: Diagnosis not present

## 2022-03-28 DIAGNOSIS — R202 Paresthesia of skin: Secondary | ICD-10-CM | POA: Diagnosis not present

## 2022-03-28 DIAGNOSIS — M5000 Cervical disc disorder with myelopathy, unspecified cervical region: Secondary | ICD-10-CM | POA: Diagnosis not present

## 2022-03-28 DIAGNOSIS — R29818 Other symptoms and signs involving the nervous system: Secondary | ICD-10-CM | POA: Diagnosis not present

## 2022-03-28 LAB — DIFFERENTIAL
Abs Immature Granulocytes: 0.01 10*3/uL (ref 0.00–0.07)
Basophils Absolute: 0 10*3/uL (ref 0.0–0.1)
Basophils Relative: 0 %
Eosinophils Absolute: 0.1 10*3/uL (ref 0.0–0.5)
Eosinophils Relative: 4 %
Immature Granulocytes: 0 %
Lymphocytes Relative: 43 %
Lymphs Abs: 1.3 10*3/uL (ref 0.7–4.0)
Monocytes Absolute: 0.3 10*3/uL (ref 0.1–1.0)
Monocytes Relative: 9 %
Neutro Abs: 1.3 10*3/uL — ABNORMAL LOW (ref 1.7–7.7)
Neutrophils Relative %: 44 %

## 2022-03-28 LAB — CBC
HCT: 40.2 % (ref 39.0–52.0)
Hemoglobin: 13.4 g/dL (ref 13.0–17.0)
MCH: 28 pg (ref 26.0–34.0)
MCHC: 33.3 g/dL (ref 30.0–36.0)
MCV: 83.9 fL (ref 80.0–100.0)
Platelets: 194 10*3/uL (ref 150–400)
RBC: 4.79 MIL/uL (ref 4.22–5.81)
RDW: 12.2 % (ref 11.5–15.5)
WBC: 3 10*3/uL — ABNORMAL LOW (ref 4.0–10.5)
nRBC: 0 % (ref 0.0–0.2)

## 2022-03-28 LAB — COMPREHENSIVE METABOLIC PANEL
ALT: 35 U/L (ref 0–44)
AST: 30 U/L (ref 15–41)
Albumin: 4.2 g/dL (ref 3.5–5.0)
Alkaline Phosphatase: 41 U/L (ref 38–126)
Anion gap: 10 (ref 5–15)
BUN: 11 mg/dL (ref 6–20)
CO2: 25 mmol/L (ref 22–32)
Calcium: 9.4 mg/dL (ref 8.9–10.3)
Chloride: 102 mmol/L (ref 98–111)
Creatinine, Ser: 1.1 mg/dL (ref 0.61–1.24)
GFR, Estimated: >60 mL/min (ref >60)
Glucose, Bld: 118 mg/dL — ABNORMAL HIGH (ref 70–99)
Potassium: 3.3 mmol/L — ABNORMAL LOW (ref 3.5–5.1)
Sodium: 137 mmol/L (ref 135–145)
Total Bilirubin: 1.2 mg/dL (ref 0.3–1.2)
Total Protein: 7.5 g/dL (ref 6.5–8.1)

## 2022-03-28 LAB — I-STAT CHEM 8, ED
BUN: 16 mg/dL (ref 6–20)
Calcium, Ion: 1.12 mmol/L — ABNORMAL LOW (ref 1.15–1.40)
Chloride: 104 mmol/L (ref 98–111)
Creatinine, Ser: 1.1 mg/dL (ref 0.61–1.24)
Glucose, Bld: 116 mg/dL — ABNORMAL HIGH (ref 70–99)
HCT: 41 % (ref 39.0–52.0)
Hemoglobin: 13.9 g/dL (ref 13.0–17.0)
Potassium: 3.9 mmol/L (ref 3.5–5.1)
Sodium: 141 mmol/L (ref 135–145)
TCO2: 27 mmol/L (ref 22–32)

## 2022-03-28 LAB — MAGNESIUM: Magnesium: 1.8 mg/dL (ref 1.7–2.4)

## 2022-03-28 LAB — TROPONIN I (HIGH SENSITIVITY): Troponin I (High Sensitivity): 3 ng/L (ref <18)

## 2022-03-28 LAB — PROTIME-INR
INR: 1.1 (ref 0.8–1.2)
Prothrombin Time: 14 seconds (ref 11.4–15.2)

## 2022-03-28 MED ORDER — POTASSIUM CHLORIDE CRYS ER 20 MEQ PO TBCR
40.0000 meq | EXTENDED_RELEASE_TABLET | Freq: Once | ORAL | Status: AC
  Administered 2022-03-28: 40 meq via ORAL
  Filled 2022-03-28: qty 2

## 2022-03-28 MED ORDER — GADOBUTROL 1 MMOL/ML IV SOLN
9.0000 mL | Freq: Once | INTRAVENOUS | Status: AC | PRN
  Administered 2022-03-28: 9 mL via INTRAVENOUS

## 2022-03-28 NOTE — ED Provider Notes (Signed)
[facility] Provider Note   CSN: [id number] Arrival date & time: 03/28/22  0759     History  Chief Complaint  Patient presents with   Numbness    Shawn Franco is a 52 y.o. male.  HPI Patient presents for strokelike symptoms.  He states that he does have a history of intermittent left arm numbness and paresthesias.  He noticed a left arm numbness yesterday at 2 PM.  He was working at the time.  Symptoms continued throughout the evening.  Last night, approximately 8 PM, he noticed some left leg numbness and paresthesias.  This is a new symptom for him.  He went to bed and got up to go to work today.  Today, he noticed some left facial numbness and paresthesias.  Patient left work to come to the ED for evaluation.  Patient denies any areas of weakness.  He denies any chest pain.    Home Medications Prior to Admission medications   Medication Sig Start Date End Date Taking? Authorizing Provider  acetaminophen (TYLENOL) 500 MG tablet Take 500-1,000 mg by mouth every 6 (six) hours as needed (for pain.).    [provider]  cetirizine (ZYRTEC) 10 MG tablet 1 tablet Orally Once a day    [provider]  fluticasone (FLONASE) 50 MCG/ACT nasal spray Place 2 sprays into both nostrils as needed. Patient taking differently: Place 2 sprays into both nostrils daily as needed for allergies. 11/19/19   Inda Coke, PA  methocarbamol (ROBAXIN) 750 MG tablet Take 1 tablet (750 mg total) by mouth every 8 (eight) hours as needed for muscle spasms. 08/24/21   Inda Coke, PA  omeprazole (PRILOSEC) 20 MG capsule Take 1 capsule (20 mg total) by mouth daily. 05/11/21   Mansouraty, Telford Nab., MD  valACYclovir (VALTREX) 1000 MG tablet Take 2 tablets (2,000 mg total) by mouth 2 (two) times daily  for 1 day and may repeat with future outbreaks 08/25/21   Inda Coke, PA  VITAMIN D PO Take by mouth. Takes occasionally    [provider]      Allergies    Procaine and Tetanus toxoids    Review of Systems   Review of Systems  Neurological:  Positive for numbness.  All other systems reviewed and are negative.   Physical Exam Updated Vital Signs BP 138/86 (BP Location: Right Arm)   Pulse 91   Temp 98.7 F (37.1 C) (Oral)   Resp 19   Ht '5\' 11"'$  (1.803 m)   Wt 90.3 kg   SpO2 99%   BMI 27.75 kg/m  Physical Exam Vitals and nursing note reviewed.  Constitutional:      General: He is not in acute distress.    Appearance: Normal appearance. He is well-developed. He is not ill-appearing, toxic-appearing or diaphoretic.  HENT:     Head: Normocephalic and atraumatic.     Right Ear: External ear normal.     Left Ear: External ear normal.     Nose: Nose normal.     Mouth/Throat:     Mouth: Mucous membranes are moist.  Eyes:     Extraocular Movements: Extraocular movements intact.     Conjunctiva/sclera: Conjunctivae normal.  Cardiovascular:     Rate and Rhythm: Normal rate and regular rhythm.     Heart sounds: No murmur heard. Pulmonary:     Effort: Pulmonary effort is normal. No respiratory distress.  Abdominal:     General: There is  no distension.     Palpations: Abdomen is soft.  Musculoskeletal:        General: No swelling. Normal range of motion.     Cervical back: Normal range of motion and neck supple.     Right lower leg: No edema.     Left lower leg: No edema.  Skin:    General: Skin is warm and dry.     Capillary Refill: Capillary refill takes less than 2 seconds.     Coloration: Skin is not jaundiced or pale.  Neurological:     General: No focal deficit present.     Mental Status: He is alert and oriented to person, place, and time.     Cranial Nerves: Facial asymmetry (very slight) present. No dysarthria.     Sensory: Sensory deficit (Slightly diminished sensation to left hemibody) present.     Motor: Motor function is intact. No weakness or abnormal muscle tone.      Coordination: Coordination is intact.  Psychiatric:        Mood and Affect: Mood normal.     ED Results / Procedures / Treatments   Labs (all labs ordered are listed, but only abnormal results are displayed) Labs Reviewed  CBC - Abnormal; Notable for the following components:      Result Value   WBC 3.0 (*)    All other components within normal limits  DIFFERENTIAL - Abnormal; Notable for the following components:   Neutro Abs 1.3 (*)    All other components within normal limits  COMPREHENSIVE METABOLIC PANEL - Abnormal; Notable for the following components:   Potassium 3.3 (*)    Glucose, Bld 118 (*)    All other components within normal limits  I-STAT CHEM 8, ED - Abnormal; Notable for the following components:   Glucose, Bld 116 (*)    Calcium, Ion 1.12 (*)    All other components within normal limits  PROTIME-INR  MAGNESIUM  TROPONIN I (HIGH SENSITIVITY)    EKG EKG Interpretation  Date/Time:  Monday March 28 2022 08:07:55 EDT Ventricular Rate:  85 PR Interval:  149 QRS Duration: 91 QT Interval:  388 QTC Calculation: 462 R Axis:   6 Text Interpretation: Sinus rhythm Probable left atrial enlargement Confirmed by Godfrey Pick ([id number]) on 03/28/2022 8:20:35 AM  Radiology MR BRAIN W WO CONTRAST  Result Date: 03/28/2022 CLINICAL DATA:  Myelopathy, acute, cervical spine; Neuro deficit, acute, stroke suspected EXAM: MRI HEAD WITHOUT AND WITH CONTRAST MRI CERVICAL SPINE WITHOUT AND WITH CONTRAST CONTRAST:  3m GADAVIST GADOBUTROL 1 MMOL/ML IV SOLN TECHNIQUE: Multiplanar, multiecho pulse sequences of the brain and surrounding structures and cervical spine were obtained without and with intravenous contrast. COMPARISON:  None Available. FINDINGS: MRI HEAD FINDINGS Brain: No convincing evidence of acute infarct. Mild DWI hyperintensity in the left parietal juxtacortical white matter is favored to represent T2 shine through given correlate T2 hyperintensity and no convincing correlate  ADC hypointensity. No evidence of acute hemorrhage, mass lesion, midline shift or hydrocephalus. Mild patchy T2/FLAIR hyperintense in the white matter. No pathologic enhancement. Vascular: Major arterial flow voids are maintained the skull base. Skull and upper cervical spine: Normal marrow signal. Sinuses/Orbits: Largely clear sinuses.  No acute orbital findings. Other: No mastoid effusions. MRI CERVICAL SPINE FINDINGS Alignment: Normal. Vertebrae: No fracture, evidence of discitis, or bone lesion. Cord: Normal cord signal.  No abnormal cord enhancement. Posterior Fossa, vertebral arteries, paraspinal tissues: Vertebral artery flow voids are maintained. Disc levels: C2-C3: No significant disc protrusion,  foraminal stenosis, or canal stenosis. C3-C4: Right greater than left facet and uncovertebral hypertrophy with mild-to-moderate right foraminal stenosis. Patent canal and left foramen. C4-C5: Mild left facet and uncovertebral hypertrophy with mild left foraminal stenosis. Patent canal and right foramen. C5-C6: Mild facet arthropathy without significant stenosis. C6-C7: Small posterior disc osteophyte complex and mild bilateral facet and uncovertebral hypertrophy. Resulting mild right foraminal stenosis. C7-T1: No significant disc protrusion, foraminal stenosis, or canal stenosis. IMPRESSION: MRI head: 1. No evidence of acute intracranial abnormality. 2. Mild T2/FLAIR hyperintensity in the white matter, nonspecific but most likely secondary to chronic microvascular ischemic disease. MRI cervical spine: 1. Normal cord signal. 2. Mild-to-moderate right foraminal stenosis at C3-C4 and mild foraminal stenosis on the left at C4-C5 and the right at C6-C7. No significant canal stenosis. Electronically Signed   By: Margaretha Sheffield M.D.   On: 03/28/2022 12:30   MR Cervical Spine W or Wo Contrast  Result Date: 03/28/2022 CLINICAL DATA:  Myelopathy, acute, cervical spine; Neuro deficit, acute, stroke suspected EXAM: MRI  HEAD WITHOUT AND WITH CONTRAST MRI CERVICAL SPINE WITHOUT AND WITH CONTRAST CONTRAST:  80m GADAVIST GADOBUTROL 1 MMOL/ML IV SOLN TECHNIQUE: Multiplanar, multiecho pulse sequences of the brain and surrounding structures and cervical spine were obtained without and with intravenous contrast. COMPARISON:  None Available. FINDINGS: MRI HEAD FINDINGS Brain: No convincing evidence of acute infarct. Mild DWI hyperintensity in the left parietal juxtacortical white matter is favored to represent T2 shine through given correlate T2 hyperintensity and no convincing correlate ADC hypointensity. No evidence of acute hemorrhage, mass lesion, midline shift or hydrocephalus. Mild patchy T2/FLAIR hyperintense in the white matter. No pathologic enhancement. Vascular: Major arterial flow voids are maintained the skull base. Skull and upper cervical spine: Normal marrow signal. Sinuses/Orbits: Largely clear sinuses.  No acute orbital findings. Other: No mastoid effusions. MRI CERVICAL SPINE FINDINGS Alignment: Normal. Vertebrae: No fracture, evidence of discitis, or bone lesion. Cord: Normal cord signal.  No abnormal cord enhancement. Posterior Fossa, vertebral arteries, paraspinal tissues: Vertebral artery flow voids are maintained. Disc levels: C2-C3: No significant disc protrusion, foraminal stenosis, or canal stenosis. C3-C4: Right greater than left facet and uncovertebral hypertrophy with mild-to-moderate right foraminal stenosis. Patent canal and left foramen. C4-C5: Mild left facet and uncovertebral hypertrophy with mild left foraminal stenosis. Patent canal and right foramen. C5-C6: Mild facet arthropathy without significant stenosis. C6-C7: Small posterior disc osteophyte complex and mild bilateral facet and uncovertebral hypertrophy. Resulting mild right foraminal stenosis. C7-T1: No significant disc protrusion, foraminal stenosis, or canal stenosis. IMPRESSION: MRI head: 1. No evidence of acute intracranial abnormality. 2.  Mild T2/FLAIR hyperintensity in the white matter, nonspecific but most likely secondary to chronic microvascular ischemic disease. MRI cervical spine: 1. Normal cord signal. 2. Mild-to-moderate right foraminal stenosis at C3-C4 and mild foraminal stenosis on the left at C4-C5 and the right at C6-C7. No significant canal stenosis. Electronically Signed   By: FMargaretha SheffieldM.D.   On: 03/28/2022 12:30    Procedures Procedures    Medications Ordered in ED Medications  potassium chloride SA (KLOR-CON M) CR tablet 40 mEq (has no administration in time range)  gadobutrol (GADAVIST) 1 MMOL/ML injection 9 mL (9 mLs Intravenous Contrast Given 03/28/22 1213)    ED Course/ Medical Decision Making/ A&P                             Medical Decision Making Amount and/or Complexity of Data Reviewed Labs:  ordered. Radiology: ordered.  Risk Prescription drug management.   This patient presents to the ED for concern of left-sided numbness and paresthesia this, this involves an extensive number of treatment options, and is a complaint that carries with it a high risk of complications and morbidity.  The differential diagnosis includes CVA, TIA, complex migraine, radiculopathy, neoplasm   Co morbidities that complicate the patient evaluation  GERD, seasonal allergies   Additional history obtained:  Additional history obtained from N/A External records from outside source obtained and reviewed including EMR   Lab Tests:  I Ordered, and personally interpreted labs.  The pertinent results include: Mild hypokalemia with otherwise normal electrolytes, baseline leukopenia, normal hemoglobin, normal troponin   Imaging Studies ordered:  I ordered imaging studies including MRI brain and cervical spine I independently visualized and interpreted imaging which showed no acute findings.  Chronic mild to moderate areas of foraminal stenoses I agree with the radiologist interpretation   Cardiac  Monitoring: / EKG:  The patient was maintained on a cardiac monitor.  I personally viewed and interpreted the cardiac monitored which showed an underlying rhythm of: Sinus rhythm  Problem List / ED Course / Critical interventions / Medication management  Patient is a healthy 52 year old male presenting for left-sided numbness and paresthesias.  This was first noticed yesterday at 2 PM in his left arm.  Last night, he developed symptoms in his left leg.  Today, he has developed symptoms in his left face.  Patient has no history of CVA.  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  He is worried about possible CVA and/or neoplasm.  On exam, he does not have any areas of focal weakness.  He does endorse a "different" feeling to light touch throughout his left hemibody.  Patient's last normal was 18 hours ago.  Symptoms not consistent with LVO.  Diagnostic workup was initiated.  Patient's lab work is reassuring.  He does have mild hypokalemia and replacement potassium was ordered.  MRI brain and cervical spine did not show any acute findings to explain his recent symptoms.  He does have some areas of chronic foraminal stenoses that likely explain intermittent left arm paresthesias.  He was advised of workup results and does feel comfortable with discharge home.  He was discharged in good condition. I ordered medication including potassium chloride for mild hypokalemia Reevaluation of the patient after these medicines showed that the patient stayed the same I have reviewed the patients home medicines and have made adjustments as needed   Social Determinants of Health:  Has access to outpatient care, highly medically literate        Final Clinical Impression(s) / ED Diagnoses Final diagnoses:  Left sided numbness    Rx / DC Orders ED Discharge Orders     None         Godfrey Pick, MD 03/28/22 1256

## 2022-03-28 NOTE — ED Triage Notes (Signed)
Pt reports L arm numbness that started around 1400 yesterday and has now progressed down the L leg and into the face around 2000.   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

## 2022-03-28 NOTE — Discharge Instructions (Signed)
Radiology impressions of your MRIs are as follows:  "MRI head:  1. No evidence of acute intracranial abnormality. 2. Mild T2/FLAIR hyperintensity in the white matter, nonspecific but most likely secondary to chronic microvascular ischemic disease.   MRI cervical spine:  1. Normal cord signal. 2. Mild-to-moderate right foraminal stenosis at C3-C4 and mild foraminal stenosis on the left at C4-C5 and the right at C6-C7. No significant canal stenosis."  Return to the ED for any new or worsening symptoms of concern.

## 2022-03-29 ENCOUNTER — Telehealth: Payer: Self-pay

## 2022-03-29 LAB — HEMOGLOBIN A1C: Hemoglobin A1C: 4.9

## 2022-03-29 LAB — HEPATIC FUNCTION PANEL
ALT: 31 U/L (ref 10–40)
AST: 27 (ref 14–40)
Alkaline Phosphatase: 48 (ref 25–125)
Bilirubin, Total: 0.7

## 2022-03-29 LAB — COMPREHENSIVE METABOLIC PANEL
Albumin: 4.6 (ref 3.5–5.0)
Calcium: 9.5 (ref 8.7–10.7)
Globulin: 2.8
eGFR: 68

## 2022-03-29 LAB — LIPID PANEL
Cholesterol: 136 (ref 0–200)
HDL: 47 (ref 35–70)
LDL Cholesterol: 79
Triglycerides: 41 (ref 40–160)

## 2022-03-29 LAB — CBC: RBC: 4.89 (ref 3.87–5.11)

## 2022-03-29 LAB — CBC AND DIFFERENTIAL
HCT: 42 (ref 41–53)
Hemoglobin: 13.8 (ref 13.5–17.5)
Neutrophils Absolute: 1.7
Platelets: 210 10*3/uL (ref 150–400)
WBC: 4.3

## 2022-03-29 LAB — BASIC METABOLIC PANEL
BUN: 15 (ref 4–21)
CO2: 20 (ref 13–22)
Chloride: 107 (ref 99–108)
Creatinine: 1.3 (ref 0.6–1.3)
Glucose: 101
Potassium: 4.4 mEq/L (ref 3.5–5.1)
Sodium: 143 (ref 137–147)

## 2022-03-29 LAB — PSA: PSA: 0.5

## 2022-03-29 LAB — TSH: TSH: 1.47 (ref 0.41–5.90)

## 2022-03-29 NOTE — Transitions of Care (Post Inpatient/ED Visit) (Signed)
   03/29/2022  Name: Shawn Franco MRN: [id number] DOB: 1970/11/27  Today's TOC FU Call Status:    Transition Care Management Follow-up Telephone Call Date of Discharge: 03/28/22 Discharge [facility]) Type of Discharge: Emergency Department Reason for ED Visit: Neurologic How have you been since you were released from the hospital?: Better Any questions or concerns?: No Noted in imaging some areas of chronic foraminal stenoses  Items Reviewed: Did you receive and understand the discharge instructions provided?: Yes Medications obtained and verified?: Yes (Medications Reviewed) Any new allergies since your discharge?: No Dietary orders reviewed?: NA Do you have support at home?: Yes People in Home: spouse Name of Support/Comfort Primary Source: wife, [facility] and Equipment/Supplies: [place] Ordered?: NA Any new equipment or medical supplies ordered?: NA  Functional Questionnaire: Do you need assistance with bathing/showering or dressing?: No Do you need assistance with meal preparation?: No Do you need assistance with eating?: No Do you have difficulty maintaining continence: No Do you need assistance with getting out of bed/getting out of a chair/moving?: No Do you have difficulty managing or taking your medications?: No  Folllow up appointments reviewed: PCP Follow-up appointment confirmed?: NA Specialist Hospital Follow-up appointment confirmed?: NA Do you need transportation to your follow-up appointment?: No Do you understand care options if your condition(s) worsen?: Yes-patient verbalized understanding Patient declined appt at this time, states he will call back when he is ready to schedule f/u.    [place], [place]

## 2022-04-05 ENCOUNTER — Encounter: Payer: Self-pay | Admitting: Gastroenterology

## 2022-04-28 ENCOUNTER — Other Ambulatory Visit (HOSPITAL_COMMUNITY): Payer: Self-pay

## 2022-05-06 ENCOUNTER — Other Ambulatory Visit (HOSPITAL_COMMUNITY): Payer: Self-pay

## 2022-05-26 ENCOUNTER — Other Ambulatory Visit: Payer: Self-pay | Admitting: Gastroenterology

## 2022-05-26 ENCOUNTER — Other Ambulatory Visit (HOSPITAL_COMMUNITY): Payer: Self-pay

## 2022-05-30 ENCOUNTER — Other Ambulatory Visit: Payer: Self-pay | Admitting: Physician Assistant

## 2022-05-30 ENCOUNTER — Other Ambulatory Visit (HOSPITAL_COMMUNITY): Payer: Self-pay

## 2022-05-31 ENCOUNTER — Other Ambulatory Visit (HOSPITAL_COMMUNITY): Payer: Self-pay

## 2022-05-31 MED ORDER — OMEPRAZOLE 20 MG PO CPDR
20.0000 mg | DELAYED_RELEASE_CAPSULE | Freq: Every day | ORAL | 1 refills | Status: DC
  Filled 2022-05-31 – 2022-06-10 (×2): qty 90, 90d supply, fill #0

## 2022-05-31 NOTE — Telephone Encounter (Signed)
Okay to reorder Omeprazole 20 mg daily? Prescribed by another provider.

## 2022-06-08 ENCOUNTER — Other Ambulatory Visit (HOSPITAL_COMMUNITY): Payer: Self-pay

## 2022-06-10 ENCOUNTER — Other Ambulatory Visit (HOSPITAL_COMMUNITY): Payer: Self-pay

## 2022-07-13 NOTE — Progress Notes (Signed)
Subjective:    Shawn Franco is a 52 y.o. male and is here for a comprehensive physical exam.  HPI  Health Maintenance Due  Topic Date Due   HIV Screening  Never done   Hepatitis C Screening  Never done   DTaP/Tdap/Td (1 - Tdap) Never done    Acute Concerns: None  Chronic Issues: GERD  Stable with omeprazole 20 mg daily.  He has good symptom control with this dose.  Seasonal Allergies Treated with cetirizine 10 mg daily.  Left-sided Facial Numbness Has had this for several months. Also has tingling/numbness in left arm/leg. Seen in ED for this 03/28/22 and given potassium chloride, gadobutrol. MRI brain and C-spine showed chronic mild to moderate areas of foraminal stenoses. He was told to have CT angiogram of head and neck to rule out any vascular cause of symptom(s) - he is interested in pursuing this.  Lumbar Muscle Spasms This is longstanding. Methocarbamol is helping symptoms.  Denies urinary symptoms. 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  Health Maintenance: Immunizations -- Administered tetanus vaccine. Colonoscopy -- Last completed 05/16/19. Showed tubular adenoma. Recommended repeat in 2028. PSA -- 0.5 from [facility] this year Lab Results  Component Value Date   PSA 0.36 03/25/2019   PSA 0.36 02/13/2017   Diet -- Eats healthy. Sleep habits -- Stable Exercise -- Plays pickle ball.  Weight --  Recent weight history Wt Readings from Last 10 Encounters:  07/20/22 203 lb 6.1 oz (92.3 kg)  03/28/22 199 lb (90.3 kg)  08/23/21 205 lb 4 oz (93.1 kg)  11/03/20 200 lb (90.7 kg)  11/28/19 207 lb (93.9 kg)  11/19/19 207 lb 4 oz (94 kg)  10/15/19 211 lb 6.4 oz (95.9 kg)  05/16/19 212 lb (96.2 kg)  03/26/19 212 lb (96.2 kg)  03/25/19 213 lb 8 oz (96.8 kg)   Body mass index is 28.37 kg/m.  Mood -- Stable Alcohol use --  reports no history of alcohol use.  Tobacco use --  Tobacco Use: Low Risk  (07/20/2022)   Patient History    Smoking Tobacco  Use: Never    Smokeless Tobacco Use: Never    Passive Exposure: Not on file    Eligible for Low Dose CT? No  UTD with eye doctor? Not UTD- hasn't had exam in 3 years. UTD with dentist? UTD     07/20/2022    1:16 PM  Depression screen PHQ 2/9  Decreased Interest 0  Down, Depressed, Hopeless 0  PHQ - 2 Score 0    Other providers/specialists: Patient Care Team: Jarold Motto, [place] as PCP - General (Physician Assistant)    PMHx, SurgHx, SocialHx, Medications, and Allergies were reviewed in the Visit Navigator and updated as appropriate.   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 Meridee Score   Gastroesophageal reflux disease without esophagitis 02/13/2017   H. pylori infection 04/2019   History of colon polyps 2021   Seasonal allergic rhinitis due to pollen 02/13/2017     Past Surgical History:  Procedure Laterality Date   BIOPSY  03/04/2021   Procedure: BIOPSY;  Surgeon: Lemar Lofty., MD;  Location: [facility] ENDOSCOPY;  Service: Gastroenterology;;   [facility] CANALOPLASTY     ESOPHAGOGASTRODUODENOSCOPY (EGD) WITH PROPOFOL N/A 03/04/2021   Procedure: ESOPHAGOGASTRODUODENOSCOPY (EGD) WITH PROPOFOL;  Surgeon: Lemar Lofty., MD;  Location[facility]COPY;  Service: Gastroenterology;  Laterality: N/A;   EUS N/A 03/04/2021   Procedure: UPPER ENDOSCOPIC ULTRASOUND (EUS) RADIAL;  Surgeon: Lemar Lofty., MD;  Location: [facility] ENDOSCOPY;  Service: Gastroenterology;  Laterality: N/A;   NOSE SURGERY       Family History  Problem Relation Age of Onset   Diabetes Mother    Hypertension Mother    Colon cancer Neg Hx    Esophageal cancer Neg Hx    Stomach cancer Neg Hx    Prostate cancer Neg Hx     Social History   Tobacco Use   Smoking status: Never   Smokeless tobacco: Never  Vaping Use   Vaping Use: Never used  Substance Use Topics   Alcohol use: Never   Drug use: Never    Review of Systems:   Review of Systems   Constitutional:  Negative for chills, fever, malaise/fatigue and weight loss.  HENT:  Negative for hearing loss, sinus pain and sore throat.   Respiratory:  Negative for cough, hemoptysis and shortness of breath.   Cardiovascular:  Negative for chest pain, palpitations, leg swelling and PND.       (+) Varicosities legs  Gastrointestinal:  Negative for abdominal pain, constipation, diarrhea, heartburn, nausea and vomiting.  Genitourinary:  Negative for dysuria, flank pain, frequency, hematuria and urgency.  Musculoskeletal:  Positive for back pain (Lumbar). Negative for myalgias and neck pain.  Skin:  Negative for itching and rash.  Neurological:  Positive for tingling (Left arm, leg). Negative for dizziness, seizures and headaches.       (+) Left-sided facial numbness  Endo/Heme/Allergies:  Negative for polydipsia.  Psychiatric/Behavioral:  Negative for depression. The patient is not nervous/anxious.     Objective:    Vitals:   07/20/22 1311  BP: 128/80  Pulse: 68  Temp: 98 F (36.7 C)  SpO2: 98%    Body mass index is 28.37 kg/m.  General  Alert, cooperative, no distress, appears stated age  Head:  Normocephalic, without obvious abnormality, atraumatic  Eyes:  PERRL, conjunctiva/corneas clear, EOM's intact, fundi benign, both eyes       Ears:  Normal TM's and external ear canals, both ears  Nose: Nares normal, septum midline, mucosa normal, no drainage or sinus tenderness  Throat: Lips, mucosa, and tongue normal; teeth and gums normal  Neck: Supple, symmetrical, trachea midline, no adenopathy;     thyroid:  No enlargement/tenderness/nodules; no carotid bruit or JVD  Back:   Symmetric, no curvature, ROM normal, no CVA tenderness  Lungs:   Clear to auscultation bilaterally, respirations unlabored  Chest wall:  No tenderness or deformity  Heart:  Regular rate and rhythm, S1 and S2 normal, no murmur, rub or gallop  Abdomen:   Soft, non-tender, bowel sounds active all four  quadrants, no masses, no organomegaly  Extremities: Extremities normal, atraumatic, no cyanosis or edema  Prostate : Deferred   Skin: Skin color, texture, turgor normal, no rashes or lesions  Lymph nodes: Cervical, supraclavicular, and axillary nodes normal  Neurologic: CNII-XII grossly intact. Normal strength, sensation and reflexes throughout   AssessmentPlan:   Routine physical examination Today patient counseled on age appropriate routine health concerns for screening and prevention, each reviewed and up to date or declined. Immunizations reviewed and up to date or declined. Labs ordered and reviewed. Risk factors for depression reviewed and negative. Hearing function and visual acuity are intact. ADLs screened and addressed as needed. Functional ability and level of safety reviewed and appropriate. Education, counseling and referrals performed based on assessed risks today. Patient provided with a copy of personalized plan for preventive services.  Overweight  Continue healthy lifestyle efforts  Paresthesias Will obtain CT angiogram head and neck for further evaluation and to rule out vascular cause of sx  Encounter for immunization TDAP performed; tolerated well  Chronic GERD Well controlled with daily Prilosec Follow-up endo recommended early next year   I,Alexander Ruley,acting as a scribe for [employer], PA.,have documented all relevant documentation on the behalf of Jarold Motto, PA,as directed by  Jarold Motto, PA while in the presence of Jarold Motto, [place].   I, Jarold Motto, [place], have reviewed all documentation for this visit. The documentation on 07/20/22 for the exam, diagnosis, procedures, and orders are all accurate and complete.     Jarold Motto, PA-C [facility]

## 2022-07-20 ENCOUNTER — Ambulatory Visit (INDEPENDENT_AMBULATORY_CARE_PROVIDER_SITE_OTHER): Payer: 59 | Admitting: Physician Assistant

## 2022-07-20 ENCOUNTER — Telehealth: Payer: Self-pay | Admitting: *Deleted

## 2022-07-20 ENCOUNTER — Encounter: Payer: Self-pay | Admitting: Physician Assistant

## 2022-07-20 VITALS — BP 128/80 | HR 68 | Temp 98.0°F | Ht 71.0 in | Wt 203.4 lb

## 2022-07-20 DIAGNOSIS — Z23 Encounter for immunization: Secondary | ICD-10-CM | POA: Diagnosis not present

## 2022-07-20 DIAGNOSIS — Z0000 Encounter for general adult medical examination without abnormal findings: Secondary | ICD-10-CM

## 2022-07-20 DIAGNOSIS — K219 Gastro-esophageal reflux disease without esophagitis: Secondary | ICD-10-CM | POA: Diagnosis not present

## 2022-07-20 DIAGNOSIS — R202 Paresthesia of skin: Secondary | ICD-10-CM

## 2022-07-20 DIAGNOSIS — E663 Overweight: Secondary | ICD-10-CM

## 2022-07-20 DIAGNOSIS — Z0001 Encounter for general adult medical examination with abnormal findings: Secondary | ICD-10-CM

## 2022-07-20 NOTE — Patient Instructions (Signed)
Wt Readings from Last 4 Encounters:  07/20/22 203 lb 6.1 oz (92.3 kg)  03/28/22 199 lb (90.3 kg)  08/23/21 205 lb 4 oz (93.1 kg)  11/03/20 200 lb (90.7 kg)

## 2022-07-20 NOTE — Telephone Encounter (Signed)
Called pt immediately after giving Tdap, told him I saw in allergies allergic to Tetanus. Pt said Tetanus serum, old Tetanus that was serum not vaccine. Told him okay, just wanted to make sure. Pt said he will be fine.

## 2022-08-03 ENCOUNTER — Ambulatory Visit
Admission: RE | Admit: 2022-08-03 | Discharge: 2022-08-03 | Disposition: A | Discharge status: Home / Self Care | Payer: 59 | Source: Ambulatory Visit | Attending: Physician Assistant | Admitting: Physician Assistant

## 2022-08-03 ENCOUNTER — Other Ambulatory Visit: Payer: Self-pay | Admitting: Physician Assistant

## 2022-08-03 ENCOUNTER — Ambulatory Visit: Admission: RE | Admit: 2022-08-03 | Payer: 59 | Source: Ambulatory Visit

## 2022-08-03 DIAGNOSIS — R202 Paresthesia of skin: Secondary | ICD-10-CM

## 2022-08-03 DIAGNOSIS — K219 Gastro-esophageal reflux disease without esophagitis: Secondary | ICD-10-CM

## 2022-08-03 DIAGNOSIS — Z23 Encounter for immunization: Secondary | ICD-10-CM

## 2022-08-03 DIAGNOSIS — E663 Overweight: Secondary | ICD-10-CM

## 2022-08-03 DIAGNOSIS — M47812 Spondylosis without myelopathy or radiculopathy, cervical region: Secondary | ICD-10-CM | POA: Diagnosis not present

## 2022-08-03 DIAGNOSIS — Z0000 Encounter for general adult medical examination without abnormal findings: Secondary | ICD-10-CM

## 2022-08-03 MED ORDER — IOPAMIDOL (ISOVUE-370) INJECTION 76%
75.0000 mL | Freq: Once | INTRAVENOUS | Status: AC | PRN
  Administered 2022-08-03: 75 mL via INTRAVENOUS

## 2022-08-22 ENCOUNTER — Encounter: Payer: Self-pay | Admitting: Physician Assistant

## 2022-09-10 IMAGING — CT CT HEART MORP W/ CTA COR W/ SCORE W/ CA W/CM &/OR W/O CM
1 series · 6 of 9 positions shown, 8 images · non-contrast
Comparison: None.
COMPARISON: None.

Addendum:
EXAM:
OVER-READ INTERPRETATION  CT CHEST

The following report is an over-read performed by radiologist Dr.
Yomaris Bharadwaj [REDACTED] on 12/03/2019. This
over-read does not include interpretation of cardiac or coronary
anatomy or pathology. The coronary calcium score/coronary CTA
interpretation by the cardiologist is attached.
CLINICAL DATA: 49 Year old Male
Cardiac/Coronary  CTA
TECHNIQUE: The patient was scanned on a [employer] Force scanner.

[Series 1238: — · 6 of 9 slices shown, 8 images]
[im 2/9  vessel]
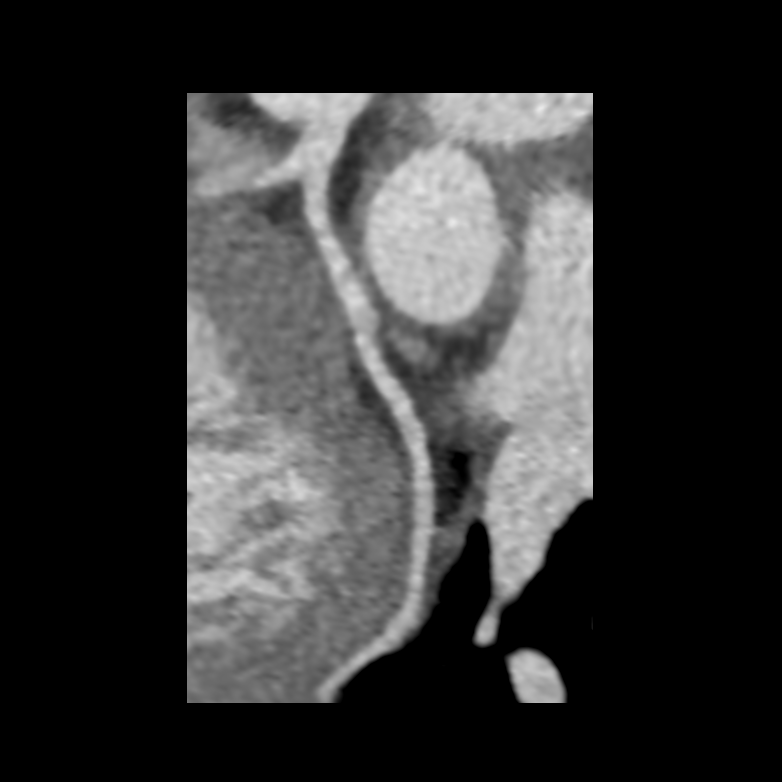
[im 2/9  lung]
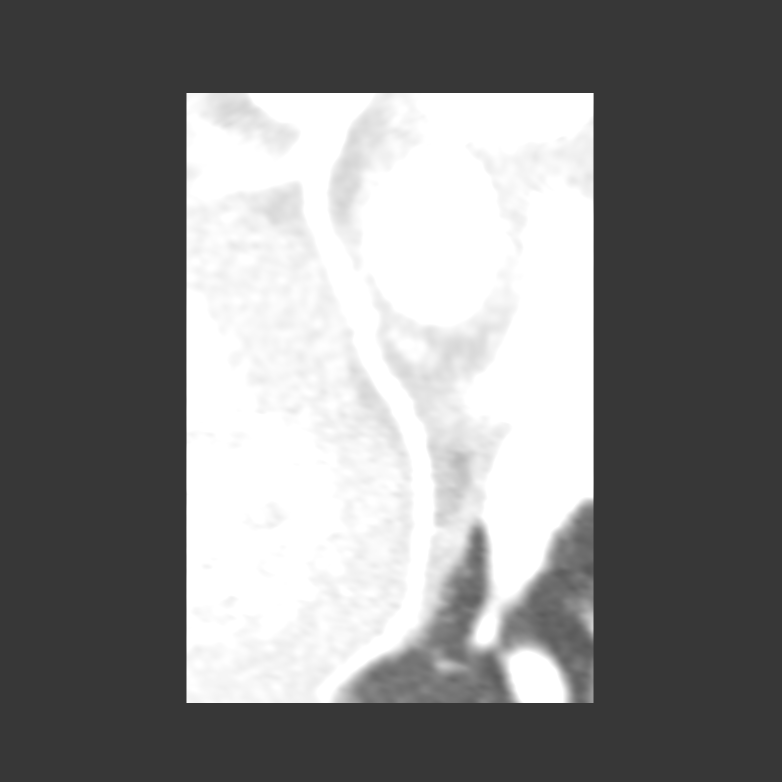
[im 3/9  vessel]
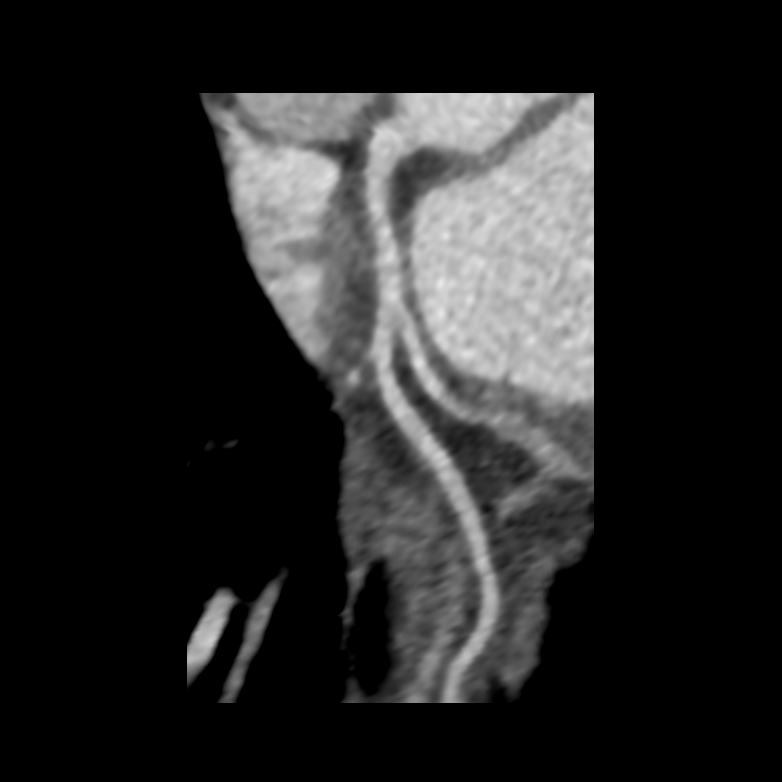
[im 4/9  vessel]
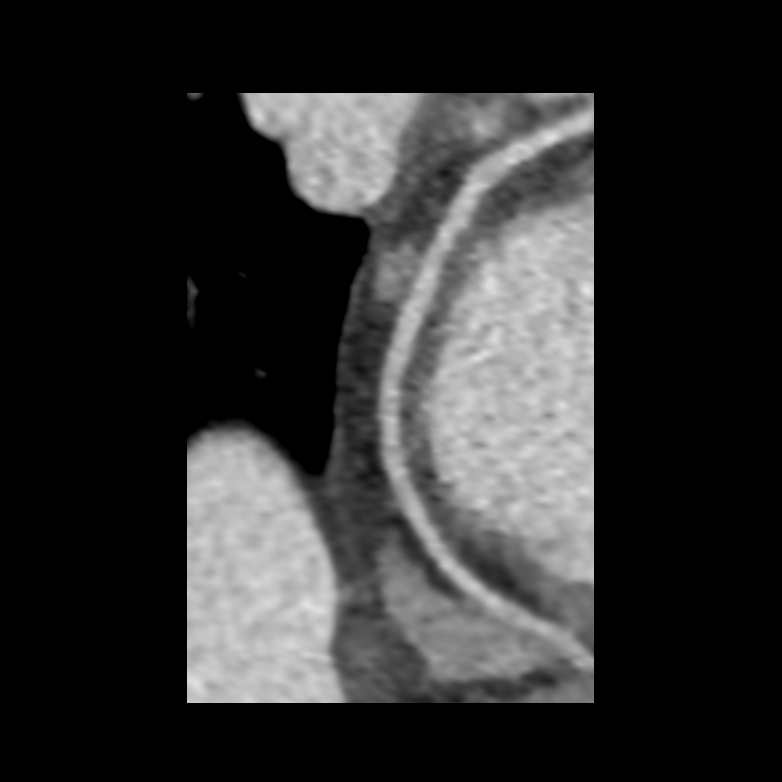
[im 6/9  vessel]
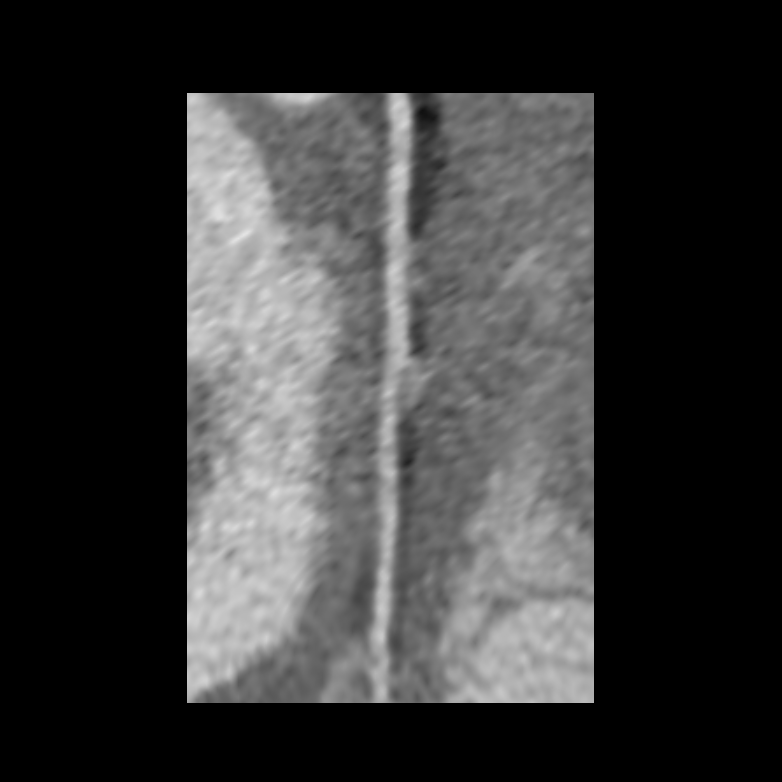
[im 7/9  vessel]
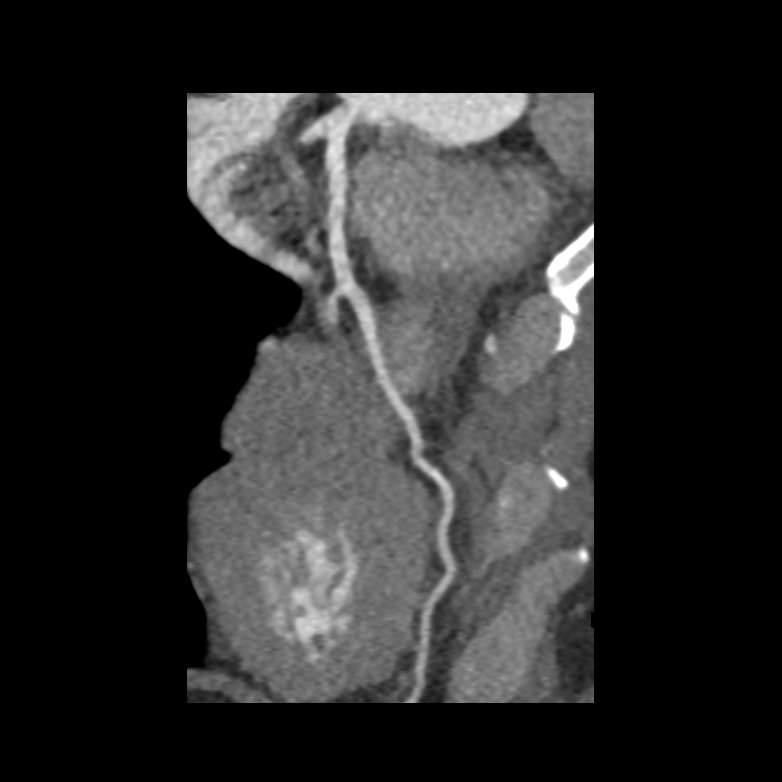
[im 7/9  lung]
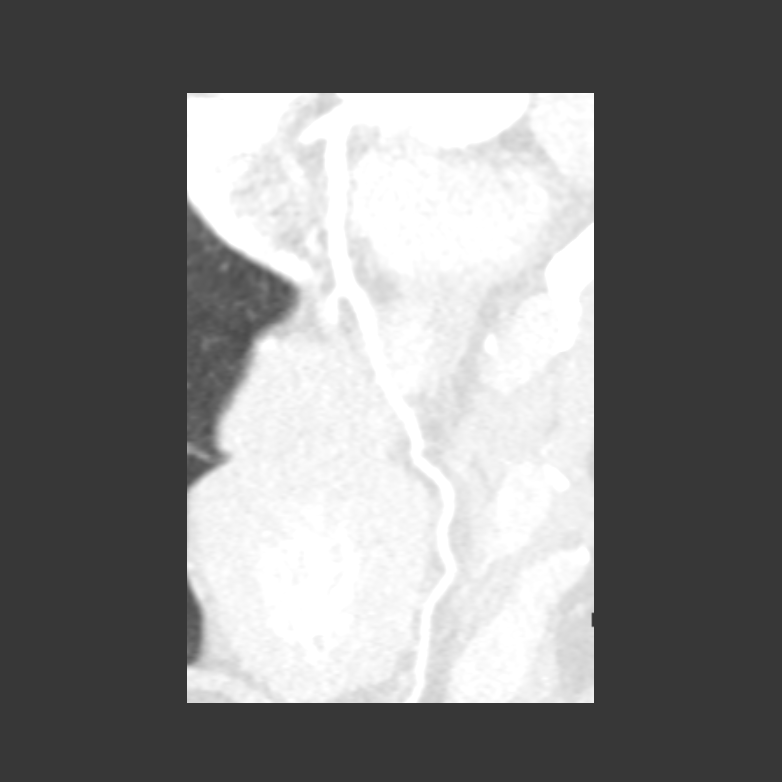
[im 8/9  vessel]
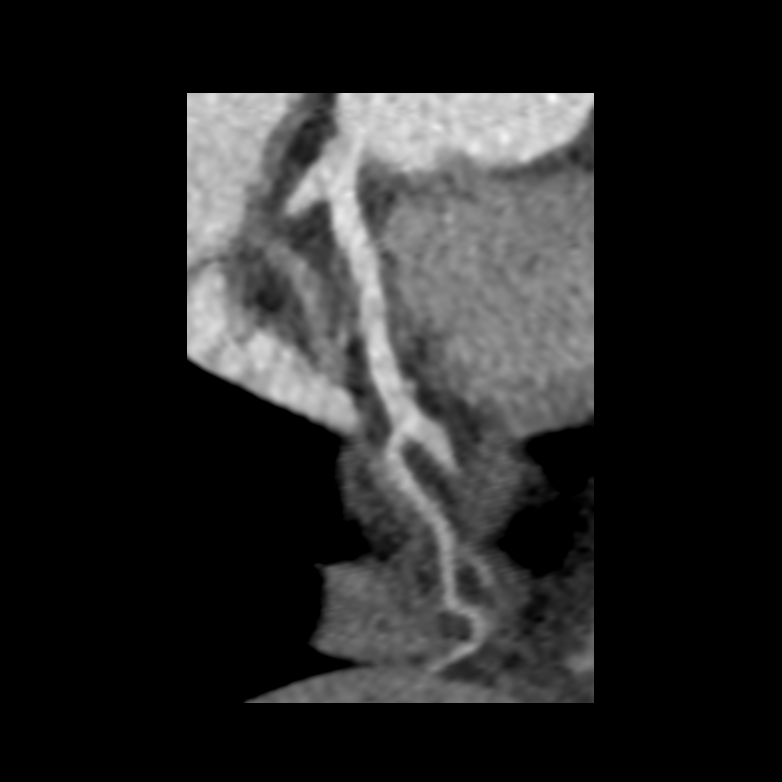

[6 of 9 positions shown; findings below may reference images not displayed]

FINDINGS: Within the visualized portions of the thorax there are no suspicious
appearing pulmonary nodules or masses, there is no acute
consolidative airspace disease, no pleural effusions, no
pneumothorax and no lymphadenopathy. Visualized portions of the
upper abdomen are unremarkable. There are no aggressive appearing
lytic or blastic lesions noted in the visualized portions of the
skeleton.
IMPRESSION: 1. No significant incidental noncardiac findings are noted.
FINDINGS: A 100 kV prospective scan was triggered in the descending thoracic
aorta at 111 HU's. Axial non-contrast 3 mm slices were carried out
through the heart. The data set was analyzed on a dedicated work
station and scored using the Agatson method. Gantry rotation speed
was 250 msecs and collimation was .6 mm. No beta blockade and 0.8 mg
of sl NTG was given. The 3D data set was reconstructed in 5%
intervals of the 67-82 % of the R-R cycle. Diastolic phases were
analyzed on a dedicated work station using MPR, MIP and VRT modes.
The patient received 80 cc of contrast.

Aorta:  Normal size.  No calcifications.  No dissection.

Aortic Valve:  Tri-leaflet.  No calcifications.

Coronary Arteries:  Normal coronary origin.  Left dominance.

Coronary calcium score of 0. This was 1st percentile for age & sex
matched control.

RCA is a large non-dominant artery that gives rise to a PLA. There
is no plaque.

Left main is a large artery that gives rise to LAD and LCX arteries.

LAD is a large vessel that gives rise to a D1 vessel and has no
plaque.

LCX is a dominant artery that gives rise to one large OM1 branch and
PDA. There is no plaque.

Other findings:

Normal pulmonary vein drainage into the left atrium.

Normal left atrial appendage without a thrombus.

Normal size of the pulmonary artery.

Extra-cardiac findings: See attached radiology report for
non-cardiac structures.
IMPRESSION: 1. Coronary calcium score of 0. This was 1st percentile for age &
sex matched control.

2. Normal coronary origin.  Left dominance.

3. CAD-RADS 0. No evidence of CAD (0%). Consider non-atherosclerotic
causes of chest pain.

*** End of Addendum ***
EXAM:
OVER-READ INTERPRETATION  CT CHEST

The following report is an over-read performed by radiologist Dr.
Yomaris Bharadwaj [REDACTED] on 12/03/2019. This
over-read does not include interpretation of cardiac or coronary
anatomy or pathology. The coronary calcium score/coronary CTA
interpretation by the cardiologist is attached.
FINDINGS: Within the visualized portions of the thorax there are no suspicious
appearing pulmonary nodules or masses, there is no acute
consolidative airspace disease, no pleural effusions, no
pneumothorax and no lymphadenopathy. Visualized portions of the
upper abdomen are unremarkable. There are no aggressive appearing
lytic or blastic lesions noted in the visualized portions of the
skeleton.
IMPRESSION: 1. No significant incidental noncardiac findings are noted.

## 2022-09-22 ENCOUNTER — Encounter: Payer: Self-pay | Admitting: Gastroenterology

## 2022-11-24 ENCOUNTER — Ambulatory Visit: Payer: 59

## 2022-11-24 VITALS — Ht 71.0 in | Wt 200.0 lb

## 2022-11-24 DIAGNOSIS — K219 Gastro-esophageal reflux disease without esophagitis: Secondary | ICD-10-CM

## 2022-11-24 DIAGNOSIS — K3189 Other diseases of stomach and duodenum: Secondary | ICD-10-CM

## 2022-11-24 NOTE — Progress Notes (Signed)
Pre visit completed via phone call; Patient verified name, DOB, and address; No egg or soy allergy known to patient  No issues known to pt with past sedation with any surgeries or procedures Patient denies ever being told they had issues or difficulty with intubation  No FH of Malignant Hyperthermia Pt is not on diet pills Pt is not on home 02  Pt is not on blood thinners  Pt denies issues with constipation  No A fib or A flutter Have any cardiac testing pending--NO Insurance verified during PV appt--- [employer] [facility] [employer] Pt can ambulate without assistance;  Pt denies use of chewing tobacco Discussed diabetic/weight loss medication holds; Discussed NSAID holds; Checked BMI to be less than 50; Pt instructed to use Single[employer] or GoodRx for a price reduction on prep  Patient's chart reviewed by Cathlyn Parsons CNRA prior to previsit and patient appropriate for the [facility].  Pre visit completed and red dot placed by patient's name on their procedure day (on provider's schedule).    Instructions sent to patient via MyChart per his request;

## 2022-12-02 ENCOUNTER — Encounter: Payer: Self-pay | Admitting: Gastroenterology

## 2022-12-20 NOTE — Progress Notes (Addendum)
Shawn Franco is a 52 y.o. male here for a new problem.  History of Present Illness:   Chief Complaint  Patient presents with   Dizziness   HPI  Dizziness: Complains of dizziness and BP irregularities.   Reports that when he checks his blood pressure at home versus at work his blood pressure there is about a 10-15 point difference between readings.   Notes since 2020, after completing a round of anti-biotics for a tooth infection, he's been having this dizziness occurring intermittently.  States he has checked his pulses before during dizziness, and they were normal.  Today in office while his blood pressure was being checked his heart rate was fluctuating with pulse oximeter from 60's to 120's. He does wear a Fitbit but hasn't ever captured his HR fluctuating like what occurred in-office today.  Reports he was asymptomatic during fluctuations.   Reports that he hasn't been very active since August once his dizziness started. Describes spontaneous onset of floating sensation.   Mentions that he does snack on days he is dizzy.   Denies vertigo or associated SOB.   Had an endoscopy yesterday, but there was not any abnormal HR activity noted.   Did have a sleep study done in 2021: very mild OSA with minimal oxygen desaturation. Average HR during sleep was 50.5 BPM.   Of note he has had significant work-up in the past: TSH, hemoglobin A1c, magnesium, CBC all within normal limits Echocardiogram in 2021  Bilateral carotid ultrasound in 2021  CTA of coronaries in November 2021  Head CT 2021  MR brain and MR cervical spine 2024  CT angio head/neck 2024  All imaging is overall normal   Past Medical History:  Diagnosis Date   Duodenal nodule 04/2019   Dr. Meridee Score   Gastroesophageal reflux disease without esophagitis 02/13/2017   H. pylori infection 04/2019   History of colon polyps 2021   Seasonal allergic rhinitis due to pollen 02/13/2017    Social History    Tobacco Use   Smoking status: Never   Smokeless tobacco: Never  Vaping Use   Vaping status: Never Used  Substance Use Topics   Alcohol use: Never   Drug use: Never   Past Surgical History:  Procedure Laterality Date   BIOPSY  03/04/2021   Procedure: BIOPSY;  Surgeon: Lemar Lofty., MD;  Location: [facility] ENDOSCOPY;  Service: Gastroenterology;;   [facility] CANALOPLASTY     ESOPHAGOGASTRODUODENOSCOPY (EGD) WITH PROPOFOL N/A 03/04/2021   Procedure: ESOPHAGOGASTRODUODENOSCOPY (EGD) WITH PROPOFOL;  Surgeon: Lemar Lofty., MD;  Location[facility] ENDOSCOPY;  Service: Gastroenterology;  Laterality: N/A;   EUS N/A 03/04/2021   Procedure: UPPER ENDOSCOPIC ULTRASOUND (EUS) RADIAL;  Surgeon: Lemar Lofty., MD;  Location: [facility] ENDOSCOPY;  Service: Gastroenterology;  Laterality: N/A;   NOSE SURGERY     Family History  Problem Relation Age of Onset   Diabetes Mother    Hypertension Mother    Colon cancer Neg Hx    Esophageal cancer Neg Hx    Stomach cancer Neg Hx    Prostate cancer Neg Hx    Colon polyps Neg Hx    Rectal cancer Neg Hx    Allergies  Allergen Reactions   Procaine Anaphylaxis   Tetanus Toxoids Anaphylaxis    Tetanus Serum   Current Medications:   Current Outpatient Medications:    acetaminophen (TYLENOL) 500 MG tablet, Take 500-1,000 mg by mouth every 6 (six) hours as needed (for pain.)., Disp: , Rfl:  cetirizine (ZYRTEC) 10 MG tablet, Take 10 mg by mouth daily as needed., Disp: , Rfl:    fluticasone (FLONASE) 50 MCG/ACT nasal spray, Place 2 sprays into both nostrils as needed. (Patient taking differently: Place 2 sprays into both nostrils daily as needed for allergies.), Disp: 16 g, Rfl: 4   methocarbamol (ROBAXIN) 750 MG tablet, Take 1 tablet (750 mg total) by mouth every 8 (eight) hours as needed for muscle spasms., Disp: 90 tablet, Rfl: 3   valACYclovir (VALTREX) 1000 MG tablet, Take 2 tablets (2,000 mg total) by mouth 2 (two) times daily  for 1 day and  may repeat with future outbreaks (Patient taking differently: Take 2,000 mg by mouth 2 (two) times daily. Fever blisters), Disp: 30 tablet, Rfl: 2   VITAMIN D PO, Take 1 tablet by mouth daily at 6 (six) AM. Takes occasionally, Disp: , Rfl:    omeprazole (PRILOSEC) 20 MG capsule, Take 1 capsule (20 mg total) by mouth daily., Disp: 90 capsule, Rfl: 3  Review of Systems:   ROS See pertinent positives and negatives as per the HPI.  Vitals:   Vitals:   12/22/22 1106 12/22/22 1109  BP: 132/78 136/82  Temp: (!) 97.1 F (36.2 C)   TempSrc: Temporal      There is no height or weight on file to calculate BMI.  Orthostatic VS for the past 72 hrs (Last 3 readings):  Orthostatic BP Patient Position BP Location Cuff Size Orthostatic Pulse  12/22/22 1200 124/86 Standing -- -- 97  12/22/22 1159 129/84 Standing -- -- 77  12/22/22 1158 124/78 Prone -- -- 64  12/22/22 1109 -- Sitting Left Arm Normal --  12/22/22 1106 -- Sitting Right Arm Normal --     Physical Exam:   Physical Exam Vitals and nursing note reviewed.  Constitutional:      General: He is not in acute distress.    Appearance: He is well-developed. He is not ill-appearing or toxic-appearing.  Cardiovascular:     Rate and Rhythm: Normal rate and regular rhythm.     Pulses: Normal pulses.     Heart sounds: Normal heart sounds, S1 normal and S2 normal.  Pulmonary:     Effort: Pulmonary effort is normal.     Breath sounds: Normal breath sounds.  Skin:    General: Skin is warm and dry.  Neurological:     Mental Status: He is alert.     GCS: GCS eye subscore is 4. GCS verbal subscore is 5. GCS motor subscore is 6.  Psychiatric:        Speech: Speech normal.        Behavior: Behavior normal. Behavior is cooperative.     Assessment and Plan:   Dizziness EKG tracing is personally reviewed.  EKG notes NSR.  No acute changes.  He has had significant workup for this issue Discussed additional work-up including: heart monitor,  additional blood work (cortisol, insulin, etc) Consider referral to cardiology vs neurology Consider treatment(s) for anxiety He is agreeable to heart monitor -- this has been ordered for him  Will advise on next steps with results  I,Emily Lagle,acting as a scribe for Jarold Motto, PA.,have documented all relevant documentation on the behalf of Jarold Motto, PA,as directed by  Jarold Motto, PA while in the presence of Jarold Motto, [place].  I, Jarold Motto, [place], have reviewed all documentation for this visit. The documentation on 12/22/22 for the exam, diagnosis, procedures, and orders are all accurate and complete.  I spent  a total of 28 minutes on this visit, today 12/22/22, which included reviewing previous notes from ER, ordering tests, discussing plan of care with patient and using shared-decision making on next steps, refilling medications, and documenting the findings in the note.   Jarold Motto, PA-C

## 2022-12-21 ENCOUNTER — Encounter: Payer: Self-pay | Admitting: Gastroenterology

## 2022-12-21 ENCOUNTER — Ambulatory Visit: Payer: 59 | Admitting: Gastroenterology

## 2022-12-21 VITALS — BP 129/74 | HR 67 | Temp 98.2°F | Resp 14 | Ht 71.0 in | Wt 200.0 lb

## 2022-12-21 DIAGNOSIS — K3189 Other diseases of stomach and duodenum: Secondary | ICD-10-CM

## 2022-12-21 DIAGNOSIS — K219 Gastro-esophageal reflux disease without esophagitis: Secondary | ICD-10-CM

## 2022-12-21 DIAGNOSIS — E669 Obesity, unspecified: Secondary | ICD-10-CM | POA: Diagnosis not present

## 2022-12-21 DIAGNOSIS — K2289 Other specified disease of esophagus: Secondary | ICD-10-CM | POA: Diagnosis not present

## 2022-12-21 DIAGNOSIS — K2950 Unspecified chronic gastritis without bleeding: Secondary | ICD-10-CM | POA: Diagnosis not present

## 2022-12-21 MED ORDER — SODIUM CHLORIDE 0.9 % IV SOLN: 500.0000 mL | INTRAVENOUS | Status: DC

## 2022-12-21 NOTE — Progress Notes (Signed)
Called to room to assist during endoscopic procedure.  Patient ID and intended procedure confirmed with present staff. Received instructions for my participation in the procedure from the performing physician.  

## 2022-12-21 NOTE — Patient Instructions (Signed)
    Await results of stomach biopsies done today  Repeat Upper Endoscopy in 3 years for suveillance  Resume previous diet & medications    YOU HAD AN ENDOSCOPIC PROCEDURE TODAY AT THE [facility]:   Refer to the procedure report that was given to you for any specific questions about what was found during the examination.  If the procedure report does not answer your questions, please call your gastroenterologist to clarify.  If you requested that your care partner not be given the details of your procedure findings, then the procedure report has been included in a sealed envelope for you to review at your convenience later.  YOU SHOULD EXPECT: Some feelings of bloating in the abdomen. Passage of more gas than usual.  Walking can help get rid of the air that was put into your GI tract during the procedure and reduce the bloating. If you had a lower endoscopy (such as a colonoscopy or flexible sigmoidoscopy) you may notice spotting of blood in your stool or on the toilet paper. If you underwent a bowel prep for your procedure, you may not have a normal bowel movement for a few days.  Please Note:  You might notice some irritation and congestion in your nose or some drainage.  This is from the oxygen used during your procedure.  There is no need for concern and it should clear up in a day or so.  SYMPTOMS TO REPORT IMMEDIATELY:   Following upper endoscopy (EGD)  Vomiting of blood or coffee ground material  New chest pain or pain under the shoulder blades  Painful or persistently difficult swallowing  New shortness of breath  Fever of 100F or higher  Black, tarry-looking stools  For urgent or emergent issues, a gastroenterologist can be reached at any hour by calling [phone]. Do not use MyChart messaging for urgent concerns.    DIET:  We do recommend a small meal at first, but then you may proceed to your regular diet.  Drink plenty of fluids but you should avoid  alcoholic beverages for 24 hours.  ACTIVITY:  You should plan to take it easy for the rest of today and you should NOT DRIVE or use heavy machinery until tomorrow (because of the sedation medicines used during the test).    FOLLOW UP: Our staff will call the number listed on your records the next business day following your procedure.  We will call around 7:15- 8:00 am to check on you and address any questions or concerns that you may have regarding the information given to you following your procedure. If we do not reach you, we will leave a message.     If any biopsies were taken you will be contacted by phone or by letter within the next 1-3 weeks.  Please call us at [phone] if you have not heard about the biopsies in 3 weeks.    SIGNATURES/CONFIDENTIALITY: You and/or your care partner have signed paperwork which will be entered into your electronic medical record.  These signatures attest to the fact that that the information above on your After Visit Summary has been reviewed and is understood.  Full responsibility of the confidentiality of this discharge information lies with you and/or your care-partner.

## 2022-12-21 NOTE — Progress Notes (Signed)
Report to PACU, RN, vss, BBS= Clear.  

## 2022-12-21 NOTE — Op Note (Signed)
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 The patient's tolerance of                            previous anesthesia was also reviewed. The risks                            and benefits of the procedure and the sedation                            options and risks were discussed with the patient.                            All questions were answered, and informed consent                            was obtained. Prior Anticoagulants: The patient has                            taken no anticoagulant or antiplatelet agents. ASA                            Grade Assessment: II - A patient with mild systemic                            disease. After reviewing the risks and benefits,                            the patient was deemed in satisfactory condition to                            undergo the procedure.  After obtaining informed consent, the endoscope was                            passed under direct vision. Throughout the                            procedure, the patient's blood pressure, pulse, and                            oxygen saturations were monitored continuously. The                            Olympus scope [id number] was introduced through the                            mouth,  and advanced to the second part of duodenum.                            The upper GI endoscopy was accomplished without                            difficulty. The patient tolerated the procedure. Scope In: Scope Out: Findings:                 No gross lesions were noted in the entire esophagus.                           The Z-line was irregular and was found 41 cm from                            the incisors.                           Patchy mildly erythematous mucosa without bleeding                            was found in the entire examined stomach. Biopsies                            were taken with a cold forceps for histology.                            Biopsies were taken with a cold forceps for                            histology. Biopsies were taken with a cold forceps                            for histology.                           A prominent major papilla was noted (previously  evaluated with EUS).                           No other gross lesions were noted in the duodenal                            bulb, in the first portion of the duodenum and in                            the second portion of the duodenum. Complications:            No immediate complications. Estimated Blood Loss:     Estimated blood loss was minimal. Impression:               - No gross lesions in the entire esophagus.                           - Z-line irregular, 41 cm from the incisors.                           - Erythematous mucosa in the stomach. Biopsied.                           - Prominent major papilla.                           - No other gross lesions in the duodenal bulb, in                            the first portion of the duodenum and in the second                            portion of the duodenum. Recommendation:           - The patient will be observed post-procedure,                            until all discharge criteria are met.                            - Discharge patient to home.                           - Patient has a contact number available for                            emergencies. The signs and symptoms of potential                            delayed complications were discussed with the                            patient. Return to normal activities tomorrow.  Written discharge instructions were provided to the                            patient.                           - Resume previous diet.                           - Continue present medications.                           - Await pathology results.                           - Repeat upper endoscopy in 3 years for                            surveillance of GIM.                           - If evidence of extensive gastritis, query daily                            dosing of PPI rather than as needed dosing.                           - The findings and recommendations were discussed                            with the patient. Corliss Parish, MD 12/21/2022 11:01:25 AM

## 2022-12-21 NOTE — Progress Notes (Signed)
GASTROENTEROLOGY PROCEDURE H&P NOTE   Primary Care Physician: Jarold Motto, PA  HPI: Shawn Franco is a 52 y.o. male who presents for EGD for follow-up of gastric intestinal metaplasia.  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 Meridee Score   Gastroesophageal reflux disease without esophagitis 02/13/2017   H. pylori infection 04/2019   History of colon polyps 2021   Seasonal allergic rhinitis due to pollen 02/13/2017   Past Surgical History:  Procedure Laterality Date   BIOPSY  03/04/2021   Procedure: BIOPSY;  Surgeon: Lemar Lofty., MD;  Location: [facility] ENDOSCOPY;  Service: Gastroenterology;;   [facility] CANALOPLASTY     ESOPHAGOGASTRODUODENOSCOPY (EGD) WITH PROPOFOL N/A 03/04/2021   Procedure: ESOPHAGOGASTRODUODENOSCOPY (EGD) WITH PROPOFOL;  Surgeon: Lemar Lofty., MD;  Location: [facility] ENDOSCOPY;  Service: Gastroenterology;  Laterality: N/A;   EUS N/A 03/04/2021   Procedure: UPPER ENDOSCOPIC ULTRASOUND (EUS) RADIAL;  Surgeon: Lemar Lofty., MD;  Location: [facility] ENDOSCOPY;  Service: Gastroenterology;  Laterality: N/A;   NOSE SURGERY     Current Outpatient Medications  Medication Sig Dispense Refill   omeprazole (PRILOSEC) 20 MG capsule Take 1 capsule (20 mg total) by mouth daily. (Patient taking differently: Take 20 mg by mouth daily as needed.) 90 capsule 1   VITAMIN D PO Take 1 tablet by mouth daily at 6 (six) AM. Takes occasionally     acetaminophen (TYLENOL) 500 MG tablet Take 500-1,000 mg by mouth every 6 (six) hours as needed (for pain.).     cetirizine (ZYRTEC) 10 MG tablet Take 10 mg by mouth daily as needed.     fluticasone (FLONASE) 50 MCG/ACT nasal spray Place 2 sprays into both nostrils as needed. (Patient taking differently: Place 2 sprays into both nostrils daily as needed for allergies.) 16 g 4   methocarbamol (ROBAXIN) 750 MG tablet Take 1 tablet (750 mg total) by mouth every 8 (eight) hours as needed for  muscle spasms. 90 tablet 3   valACYclovir (VALTREX) 1000 MG tablet Take 2 tablets (2,000 mg total) by mouth 2 (two) times daily  for 1 day and may repeat with future outbreaks (Patient taking differently: Take 2,000 mg by mouth 2 (two) times daily. Fever blisters) 30 tablet 2   Current Facility-Administered Medications  Medication Dose Route Frequency Provider Last Rate Last Admin   0.9 %  sodium chloride infusion  500 mL Intravenous Continuous Mansouraty, Netty Starring., MD        Current Outpatient Medications:    omeprazole (PRILOSEC) 20 MG capsule, Take 1 capsule (20 mg total) by mouth daily. (Patient taking differently: Take 20 mg by mouth daily as needed.), Disp: 90 capsule, Rfl: 1   VITAMIN D PO, Take 1 tablet by mouth daily at 6 (six) AM. Takes occasionally, Disp: , Rfl:    acetaminophen (TYLENOL) 500 MG tablet, Take 500-1,000 mg by mouth every 6 (six) hours as needed (for pain.)., Disp: , Rfl:    cetirizine (ZYRTEC) 10 MG tablet, Take 10 mg by mouth daily as needed., Disp: , Rfl:    fluticasone (FLONASE) 50 MCG/ACT nasal spray, Place 2 sprays into both nostrils as needed. (Patient taking differently: Place 2 sprays into both nostrils daily as needed for allergies.), Disp: 16 g, Rfl: 4   methocarbamol (ROBAXIN) 750 MG tablet, Take 1 tablet (750 mg total) by mouth every 8 (eight) hours as needed for muscle spasms., Disp: 90 tablet, Rfl: 3   valACYclovir (VALTREX) 1000 MG tablet, Take 2 tablets (  2,000 mg total) by mouth 2 (two) times daily  for 1 day and may repeat with future outbreaks (Patient taking differently: Take 2,000 mg by mouth 2 (two) times daily. Fever blisters), Disp: 30 tablet, Rfl: 2  Current Facility-Administered Medications:    0.9 %  sodium chloride infusion, 500 mL, Intravenous, Continuous, Mansouraty, Netty Starring., MD Allergies  Allergen Reactions   Procaine Anaphylaxis   Tetanus Toxoids Anaphylaxis    Tetanus Serum   Family History  Problem Relation Age of Onset    Diabetes Mother    Hypertension Mother    Colon cancer Neg Hx    Esophageal cancer Neg Hx    Stomach cancer Neg Hx    Prostate cancer Neg Hx    Colon polyps Neg Hx    Rectal cancer Neg Hx    Social History   Socioeconomic History   Marital status: Married    Spouse name: Not on file   Number of children: Not on file   Years of education: Not on file   Highest education level: Not on file  Occupational History   Not on file  Tobacco Use   Smoking status: Never   Smokeless tobacco: Never  Vaping Use   Vaping status: Never Used  Substance and Sexual Activity   Alcohol use: Never   Drug use: Never   Sexual activity: Yes  Other Topics Concern   Not on file  Social History Narrative   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 GEN: NAD EYE: Sclerae anicteric ENT: MMM CV: Non-tachycardic GI: Soft, NT/ND NEURO:  Alert & Oriented x 3  Lab Results: No results for input(s): "WBC", "HGB", "HCT", "PLT" in the last 72 hours. BMET No results for input(s): "NA", "K", "CL", "CO2", "GLUCOSE", "BUN", "CREATININE", "CALCIUM" in the last 72 hours. LFT No results for input(s): "PROT", "ALBUMIN", "AST", "ALT", "ALKPHOS", "BILITOT", "BILIDIR", "IBILI" in the last 72 hours. PT/INR No results for input(s): "LABPROT", "INR" in the last 72 hours.   Impression / Plan: This is a 52 y.o.male who presents for EGD for follow-up of gastric intestinal metaplasia.  The risks and benefits of endoscopic  evaluation/treatment were discussed with the patient and/or family; these include but are not limited to the risk of perforation, infection, bleeding, missed lesions, lack of diagnosis, severe illness requiring hospitalization, as well as anesthesia and sedation related illnesses.  The patient's history has been reviewed, patient examined, no change in status, and deemed stable for procedure.  The patient and/or family is agreeable to proceed.    Corliss Parish, MD [facility] Office # [phone]

## 2022-12-22 ENCOUNTER — Telehealth: Payer: Self-pay | Admitting: *Deleted

## 2022-12-22 ENCOUNTER — Ambulatory Visit: Payer: 59 | Admitting: Physician Assistant

## 2022-12-22 ENCOUNTER — Encounter: Payer: Self-pay | Admitting: Physician Assistant

## 2022-12-22 ENCOUNTER — Telehealth: Payer: Self-pay

## 2022-12-22 ENCOUNTER — Other Ambulatory Visit: Payer: 59

## 2022-12-22 ENCOUNTER — Other Ambulatory Visit (HOSPITAL_COMMUNITY): Payer: Self-pay

## 2022-12-22 ENCOUNTER — Ambulatory Visit: Payer: 59 | Attending: Physician Assistant

## 2022-12-22 VITALS — BP 136/82 | Temp 97.1°F

## 2022-12-22 DIAGNOSIS — R42 Dizziness and giddiness: Secondary | ICD-10-CM

## 2022-12-22 MED ORDER — OMEPRAZOLE 20 MG PO CPDR
20.0000 mg | DELAYED_RELEASE_CAPSULE | Freq: Every day | ORAL | 3 refills | Status: DC
  Filled 2022-12-22: qty 90, 90d supply, fill #0
  Filled 2023-03-29: qty 90, 90d supply, fill #1
  Filled 2023-06-23 – 2023-07-13 (×3): qty 90, 90d supply, fill #2

## 2022-12-22 NOTE — Telephone Encounter (Signed)
Request from [place], [place] to have Zio monitor placed for Dizziness. Pt in office to have monitor placed. Order placed and instructions given.

## 2022-12-22 NOTE — Telephone Encounter (Signed)
  Follow up Call-     12/21/2022    9:38 AM  Call back number  Post procedure Call Back phone  # [phone]  Permission to leave phone message Yes    Post op call attempted, no answer, WM not set up.

## 2022-12-22 NOTE — Progress Notes (Unsigned)
EP to read

## 2022-12-23 ENCOUNTER — Encounter: Payer: Self-pay | Admitting: Physician Assistant

## 2022-12-23 DIAGNOSIS — R42 Dizziness and giddiness: Secondary | ICD-10-CM | POA: Diagnosis not present

## 2022-12-23 LAB — SURGICAL PATHOLOGY

## 2022-12-24 ENCOUNTER — Encounter: Payer: Self-pay | Admitting: Gastroenterology

## 2022-12-27 ENCOUNTER — Other Ambulatory Visit (HOSPITAL_COMMUNITY): Payer: Self-pay

## 2023-01-25 ENCOUNTER — Other Ambulatory Visit: Payer: Self-pay | Admitting: Physician Assistant

## 2023-01-25 ENCOUNTER — Encounter: Payer: Self-pay | Admitting: Physician Assistant

## 2023-01-25 DIAGNOSIS — R42 Dizziness and giddiness: Secondary | ICD-10-CM

## 2023-01-25 NOTE — Telephone Encounter (Signed)
 Please see message, okay to send referral or does pt need an appt?

## 2023-01-31 NOTE — Progress Notes (Signed)
 Initial neurology clinic note  Reason for Evaluation: Consultation requested by Alexander Iba, PA for an opinion regarding dizziness. My final recommendations will be communicated back to the requesting physician by way of shared medical record or letter to requesting physician via [place]  mail.  HPI: This is [name], a 53 y.o. right-handed male with a medical history of allergies who presents to neurology clinic with the chief complaint of dizziness. The patient is alone today. 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  Patient has had dizziness for a couple of years. He can be fine for long periods of time, but then will get spells. He describes the dizziness as being lightheaded. He feels like if he does not lean on something, he may fall. He has never fallen, but it has happened during pickleball or yard work and will have to lay down. He denies room spinning or vision changes. When it does occur, it may last a couple of days. He denies any headaches.  He has previously had cardiac work up, carotid doppler, MRI brain, and cervical spine that were unremarkable (mild stenosis on cervical spine).  He endorses left face, arm, and leg that has been persistent since 03/2022. He has burning in the left arm. He got prednisone  that helped some.  His primary concern is the dizziness though. He will have a 5 day or so episode once a month. He was given hydroxyzine  and meclizine  in the past. The hydroxyzine  made him very drowsy. The meclizine  has never been taken.  The patient has not noticed any recent skin rashes nor does he report any constitutional symptoms like fever, night sweats, anorexia or unintentional weight loss.  In terms of autonomic symptoms, patient has dealt with constipation. He takes metamucil for regularity. He does not have clear orthostatic symptoms. He endorses feeling warm more often than prior.  EtOH use: None  Restrictive diet? No Family history of  neuropathy/myopathy/neurologic disease? No  Of note, patient has chronic facial asymmetry and speech impediment.   MEDICATIONS:  Outpatient Encounter Medications as of 02/01/2023  Medication Sig Note   acetaminophen (TYLENOL) 500 MG tablet Take 500-1,000 mg by mouth every 6 (six) hours as needed (for pain.). 03/03/2021: rarely   cetirizine (ZYRTEC) 10 MG tablet Take 10 mg by mouth daily as needed.    fluticasone  (FLONASE ) 50 MCG/ACT nasal spray Place 2 sprays into both nostrils as needed. (Patient taking differently: Place 2 sprays into both nostrils daily as needed for allergies.) 03/03/2021: Particularly during FALL months   methocarbamol  (ROBAXIN ) 750 MG tablet Take 1 tablet (750 mg total) by mouth every 8 (eight) hours as needed for muscle spasms.    valACYclovir  (VALTREX ) 1000 MG tablet Take 2 tablets (2,000 mg total) by mouth 2 (two) times daily  for 1 day and may repeat with future outbreaks (Patient taking differently: Take 2,000 mg by mouth 2 (two) times daily. Fever blisters)    omeprazole  (PRILOSEC) 20 MG capsule Take 1 capsule (20 mg total) by mouth daily.    VITAMIN D PO Take 1 tablet by mouth daily at 6 (six) AM. Takes occasionally    No facility-administered encounter medications on file as of 02/01/2023.    PAST MEDICAL HISTORY: Past Medical History:  Diagnosis Date   Duodenal nodule 04/2019   Dr. Brice Campi   Gastroesophageal reflux disease without esophagitis 02/13/2017   H. pylori infection 04/2019   History of colon polyps 2021   Seasonal allergic rhinitis due to pollen 02/13/2017  PAST SURGICAL HISTORY: Past Surgical History:  Procedure Laterality Date   BIOPSY  03/04/2021   Procedure: BIOPSY;  Surgeon: Brice Campi Albino Alu., MD;  Location: [facility] ENDOSCOPY;  Service: Gastroenterology;;   EAR CANALOPLASTY     ESOPHAGOGASTRODUODENOSCOPY (EGD) WITH PROPOFOL  N/A 03/04/2021   Procedure: ESOPHAGOGASTRODUODENOSCOPY (EGD) WITH PROPOFOL ;  Surgeon: Normie Becton.,  MD;  Location: [facility] ENDOSCOPY;  Service: Gastroenterology;  Laterality: N/A;   EUS N/A 03/04/2021   Procedure: UPPER ENDOSCOPIC ULTRASOUND (EUS) RADIAL;  Surgeon: Normie Becton., MD;  Location: [facility] ENDOSCOPY;  Service: Gastroenterology;  Laterality: N/A;   NOSE SURGERY      ALLERGIES: Allergies  Allergen Reactions   Procaine Anaphylaxis   Tetanus Toxoids Anaphylaxis    Tetanus Serum    FAMILY HISTORY: Family History  Problem Relation Age of Onset   Diabetes Mother    Hypertension Mother    Colon cancer Neg Hx    Esophageal cancer Neg Hx    Stomach cancer Neg Hx    Prostate cancer Neg Hx    Colon polyps Neg Hx    Rectal cancer Neg Hx     SOCIAL HISTORY: Social History   Tobacco Use   Smoking status: Never   Smokeless tobacco: Never  Vaping Use   Vaping status: Never Used  Substance Use Topics   Alcohol use: Not Currently   Drug use: Never   Social History   Social History Narrative   3 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 No distress. Cooperative with exam.  Skin: No obvious rash or jaundice. HEENT: Atraumatic. Anicteric. Lungs: Non-labored breathing on room air  Extremities: No edema. No obvious deformity.  Musculoskeletal: No obvious joint swelling. Psych: Affect appropriate.  Neurological: Mental Status: Alert. Speech fluent. No pseudobulbar affect Cranial Nerves: CNII: No RAPD. Visual fields grossly intact. CNIII, IV, VI: PERRL. No nystagmus. EOMI. CN V: Facial sensation intact bilaterally to fine touch. CN VII: Facial droop (chronic) of left face. No ptosis at rest CN VIII: Hearing grossly intact bilaterally. CN IX: No hypophonia. CN X: Palate elevates symmetrically. CN XI:  Full strength shoulder shrug bilaterally. CN XII: Tongue protrusion full and midline. No atrophy or fasciculations. Very mild dysarthria Motor: Tone is normal. No atrophy  Individual muscle group testing (MRC grade out of 5):  Movement     Neck flexion 5    Neck extension 5     Right Left   Shoulder abduction 5 5   Shoulder adduction 5 5   Shoulder ext rotation 5 5   Shoulder int rotation 5 5   Elbow flexion 5 5   Elbow extension 5 5   Wrist extension 5 5   Wrist flexion 5 5   Finger abduction - FDI 5 5   Finger abduction - ADM 5 5   Finger extension 5 5   Finger distal flexion - 2/3 5 5    Finger distal flexion - 4/5 5 5    Thumb flexion - FPL 5 5   Thumb abduction - APB 5 5    Hip flexion 5 5   Hip extension 5 5  Hip adduction 5 5   Hip abduction 5 5   Knee extension 5 5   Knee flexion 5 5   Dorsiflexion 5 5   Plantarflexion 5 5    Reflexes:  Right Left   Bicep 2+ 2+   Tricep 2+ 2+   BrRad 2+ 2+   Knee 2+ 2+   Ankle 2+ 2+    Pathological Reflexes: Babinski: flexor response bilaterally Hoffman: absent bilaterally Troemner: absent bilaterally Sensation: Pinprick: Intact in all extremities Vibration: Intact in all extremities Proprioception: Intact in bilateral great toes Coordination: Intact finger-to- nose-finger bilaterally. Romberg negative. Gait: Able to rise from chair with arms crossed unassisted. Normal, narrow-based gait. Able to tandem walk. Able to walk on toes and heels.  Lab and Test Review: Internal labs: 03/29/22: TSH wnl HbA1c: 4.9 Hepatic function panel unremarkable Lipid panel: tChol 136, LDL 79, TG 41 BMP significant for Cr 1.3 CBC unremarkable  Vit D (03/25/19): wnl  Imaging: Carotid ultrasound (11/12/19): IMPRESSION: Color duplex indicates no significant plaque, with no hemodynamically significant stenosis by duplex criteria in the extracranial cerebrovascular circulation.  CT head wo contrast (11/29/19): FINDINGS: Brain: No  evidence of acute infarction, hemorrhage, hydrocephalus, extra-axial collection or mass lesion/mass effect.   Vascular: No hyperdense vessel or unexpected calcification.   Skull: Normal. Negative for fracture or focal lesion.   Sinuses/Orbits: Asymmetric pneumatization of the mastoid air cells, which are well aerated. Paranasal sinuses are clear. Orbital structures unremarkable.   Other: None.   IMPRESSION: No acute intracranial findings.  MRI brain and cervical spine w/wo contrast (03/28/22): FINDINGS: MRI HEAD FINDINGS   Brain: No convincing evidence of acute infarct. Mild DWI hyperintensity in the left parietal juxtacortical white matter is favored to represent T2 shine through given correlate T2 hyperintensity and no convincing correlate ADC hypointensity. No evidence of acute hemorrhage, mass lesion, midline shift or hydrocephalus. Mild patchy T2/FLAIR hyperintense in the white matter. No pathologic enhancement.   Vascular: Major arterial flow voids are maintained the skull base.   Skull and upper cervical spine: Normal marrow signal.   Sinuses/Orbits: Largely clear sinuses.  No acute orbital findings.   Other: No mastoid effusions.   MRI CERVICAL SPINE FINDINGS   Alignment: Normal.   Vertebrae: No fracture, evidence of discitis, or bone lesion.   Cord: Normal cord signal.  No abnormal cord enhancement.   Posterior Fossa, vertebral arteries, paraspinal tissues: Vertebral artery flow voids are maintained.   Disc levels:   C2-C3: No significant disc protrusion, foraminal stenosis, or canal stenosis.   C3-C4: Right greater than left facet and uncovertebral hypertrophy with mild-to-moderate right foraminal stenosis. Patent canal and left foramen.   C4-C5: Mild left facet and uncovertebral hypertrophy with mild left foraminal stenosis. Patent canal and right foramen.   C5-C6: Mild facet arthropathy without significant stenosis.   C6-C7: Small posterior disc  osteophyte complex and mild bilateral facet and uncovertebral hypertrophy. Resulting mild right foraminal stenosis.   C7-T1: No significant disc protrusion, foraminal stenosis, or canal stenosis.   IMPRESSION: MRI head:   1. No evidence of acute intracranial abnormality. 2. Mild T2/FLAIR hyperintensity in the white matter, nonspecific but most likely secondary to chronic microvascular ischemic disease.   MRI cervical spine:   1. Normal cord signal. 2. Mild-to-moderate right foraminal stenosis at C3-C4 and mild foraminal stenosis on the left at C4-C5 and the right at C6-C7. No significant canal stenosis.  CTA head and neck (08/03/22): FINDINGS: CT HEAD FINDINGS   Brain:   No age  advanced or lobar predominant parenchymal atrophy.   There is no acute intracranial hemorrhage.   No demarcated cortical infarct.   No extra-axial fluid collection.   No evidence of an intracranial mass.   No midline shift.   Vascular: No hyperdense vessel.  Atherosclerotic calcifications.   Skull: No calvarial fracture or aggressive osseous lesion.   Sinuses/Orbits: No orbital mass or acute orbital finding. Trace mucosal thickening within the bilateral ethmoid, right sphenoid and bilateral maxillary sinuses.   Review of the MIP images confirms the above findings   CTA NECK FINDINGS   Aortic arch: Standard aortic branching. The visualized thoracic aorta is normal in caliber. Streak/beam hardening artifact arising from a dense left-sided contrast bolus partially obscures the left subclavian artery. Within this limitation, there is no appreciable hemodynamically significant innominate or proximal subclavian artery stenosis.   Right carotid system: CCA and ICA patent within the neck without stenosis or significant atherosclerotic disease. No evidence of dissection.   Left carotid system: CCA and ICA patent within the neck without stenosis or significant atherosclerotic disease. No  evidence of dissection.   Vertebral arteries: Venous reflux partially obscures the left vertebral artery proximal V1 segment. Within this limitation, the vertebral arteries are patent within the neck without stenosis or significant atherosclerotic disease. No evidence of dissection. The left vertebral artery is dominant.   Skeleton: Cervical spondylosis. Periapical lucency surrounding multiple teeth consistent with periodontal disease.   Other neck: No neck mass or cervical lymphadenopathy.   Upper chest: No consolidation within the imaged lung apices.   Review of the MIP images confirms the above findings   CTA HEAD FINDINGS   Anterior circulation:   The intracranial internal carotid arteries are patent. Minimal non-stenotic plaque within the intracranial right ICA. The M1 middle cerebral arteries are patent. Early MCA bifurcation on the right. No M2 proximal branch occlusion or high-grade proximal stenosis. The anterior cerebral arteries are patent. No intracranial aneurysm is identified.   Posterior circulation:   The intracranial vertebral arteries are patent. The basilar artery is patent. The posterior cerebral arteries are patent. Posterior communicating arteries are diminutive or absent, bilaterally.   Venous sinuses: Within the limitations of contrast timing, no convincing thrombus.   Anatomic variants: As described.   Review of the MIP images confirms the above findings   IMPRESSION: CT head:   No evidence of an acute intracranial abnormality.   CTA neck:   1. Venous reflux partially obscures the left vertebral artery proximal V1 segment. Within this limitation, the common carotid, internal carotid and vertebral arteries are patent within the neck without stenosis or significant atherosclerotic disease. No evidence of dissection. 2. Cervical spondylosis.   CTA head:   No intracranial large vessel occlusion or proximal high-grade arterial stenosis is  identified.  ASSESSMENT: Shawn Franco is a 53 y.o. male who presents for evaluation of dizziness and left sided numbness and tingling (left face, arm, and leg). He has a relevant medical history of allergies. His neurological examination is pertinent for chronic left facial droop and mild dysarthria. Available diagnostic data is significant for HbA1c 4.9, normal TSH, normal carotid ultrasound, MRI brain, and CTA head and neck. His MRI cervical spine showed no more than mild to moderate stenosis, but this was at the right C3-4 root which would not explain left sided symptoms. The etiology of patient's symptoms is unclear and difficult to localize. A left face, arm, and leg numbness/tingling would localize to the brain, but this is not supported by  imaging. Patient is concerned about the spondylosis on cervical spine imaging, but how this would cause dizziness or symptoms in the face, arm, and leg is unclear. Multiple etiologies are likely needed to attempt to explain symptoms. We discussed nerve testing with EMG or skin biopsy (small fibers would be involved in autonomic neuropathy), but patient wants to think about this. I will get labs to look for treatable causes.  PLAN: -Blood work: B1, B12 -Discussed EMG and skin biopsy, patient would like to wait -Would recommend meclizine  at next episode of dizziness  -Return to clinic to be determined  The impression above as well as the plan as outlined below were extensively discussed with the patient who voiced understanding. All questions were answered to their satisfaction.  When available, results of the above investigations and possible further recommendations will be communicated to the patient via telephone/MyChart. Patient to call office if not contacted after expected testing turnaround time.   Total time spent reviewing records, interview, history/exam, documentation, and coordination of care on day of encounter:  65 min   Thank you for  allowing me to participate in patient's care.  If I can answer any additional questions, I would be pleased to do so.  Rommie Coats, MD   CC: Alexander Iba, [place] [address] [place] [address]  CC: Referring provider: Alexander Iba, PA [address] [place],  [place] [address]

## 2023-02-01 ENCOUNTER — Encounter: Payer: Self-pay | Admitting: Neurology

## 2023-02-01 ENCOUNTER — Other Ambulatory Visit: Payer: 59

## 2023-02-01 ENCOUNTER — Ambulatory Visit (INDEPENDENT_AMBULATORY_CARE_PROVIDER_SITE_OTHER): Payer: 59 | Admitting: Neurology

## 2023-02-01 VITALS — BP 116/80 | HR 83 | Ht 71.0 in | Wt 205.0 lb

## 2023-02-01 DIAGNOSIS — R202 Paresthesia of skin: Secondary | ICD-10-CM

## 2023-02-01 DIAGNOSIS — R200 Anesthesia of skin: Secondary | ICD-10-CM

## 2023-02-01 DIAGNOSIS — R42 Dizziness and giddiness: Secondary | ICD-10-CM | POA: Diagnosis not present

## 2023-02-01 NOTE — Patient Instructions (Addendum)
 I am not sure the cause of your symptoms. I want to investigate further with blood work today.  We discussed EMG and skin biopsy. You will think about these further.  I recommend you try taking meclizine  with your next episode of dizziness to see if this helps.  I will be in touch when I have your results.  Please let me know if you have any questions or concerns in the meantime.   The physicians and staff at [facility] are committed to providing excellent care. You may receive a survey requesting feedback about your experience at our office. We strive to receive "very good" responses to the survey questions. If you feel that your experience would prevent you from giving the office a "very good " response, please contact our office to try to remedy the situation. We may be reached at [phone]. Thank you for taking the time out of your busy day to complete the survey.  Rommie Coats, MD [facility]

## 2023-02-06 ENCOUNTER — Encounter: Payer: Self-pay | Admitting: Neurology

## 2023-02-06 LAB — VITAMIN B12: Vitamin B-12: 698 pg/mL (ref 200–1100)

## 2023-02-06 LAB — VITAMIN B1: Vitamin B1 (Thiamine): 13 nmol/L (ref 8–30)

## 2023-02-16 ENCOUNTER — Encounter: Payer: Self-pay | Admitting: Neurology

## 2023-02-21 ENCOUNTER — Other Ambulatory Visit: Payer: Self-pay

## 2023-02-21 DIAGNOSIS — R202 Paresthesia of skin: Secondary | ICD-10-CM

## 2023-02-21 DIAGNOSIS — R42 Dizziness and giddiness: Secondary | ICD-10-CM

## 2023-03-17 ENCOUNTER — Ambulatory Visit
Admission: RE | Admit: 2023-03-17 | Discharge: 2023-03-17 | Disposition: A | Discharge status: Home / Self Care | Payer: 59 | Source: Ambulatory Visit | Attending: Neurology | Admitting: Neurology

## 2023-03-17 ENCOUNTER — Ambulatory Visit: Payer: 59 | Admitting: Neurology

## 2023-03-17 DIAGNOSIS — R200 Anesthesia of skin: Secondary | ICD-10-CM

## 2023-03-17 DIAGNOSIS — R42 Dizziness and giddiness: Secondary | ICD-10-CM

## 2023-03-17 DIAGNOSIS — R9082 White matter disease, unspecified: Secondary | ICD-10-CM | POA: Diagnosis not present

## 2023-03-17 MED ORDER — GADOPICLENOL 0.5 MMOL/ML IV SOLN
9.0000 mL | Freq: Once | INTRAVENOUS | Status: AC | PRN
Start: 2023-03-17 — End: 2023-03-17
  Administered 2023-03-17: 9 mL via INTRAVENOUS

## 2023-03-20 ENCOUNTER — Ambulatory Visit
Admission: RE | Admit: 2023-03-20 | Discharge: 2023-03-20 | Disposition: A | Discharge status: Home / Self Care | Payer: 59 | Source: Ambulatory Visit | Attending: Neurology | Admitting: Neurology

## 2023-03-20 ENCOUNTER — Ambulatory Visit
Admission: RE | Admit: 2023-03-20 | Discharge: 2023-03-20 | Disposition: A | Discharge status: Home / Self Care | Payer: 59 | Source: Ambulatory Visit | Attending: Neurology

## 2023-03-20 DIAGNOSIS — R200 Anesthesia of skin: Secondary | ICD-10-CM

## 2023-03-20 DIAGNOSIS — M5126 Other intervertebral disc displacement, lumbar region: Secondary | ICD-10-CM | POA: Diagnosis not present

## 2023-03-20 DIAGNOSIS — R202 Paresthesia of skin: Secondary | ICD-10-CM | POA: Diagnosis not present

## 2023-03-20 MED ORDER — GADOPICLENOL 0.5 MMOL/ML IV SOLN
10.0000 mL | Freq: Once | INTRAVENOUS | Status: AC | PRN
  Administered 2023-03-20: 10 mL via INTRAVENOUS

## 2023-03-22 ENCOUNTER — Encounter: Payer: Self-pay | Admitting: Neurology

## 2023-03-23 ENCOUNTER — Encounter: Payer: Self-pay | Admitting: Neurology

## 2023-03-23 ENCOUNTER — Encounter: Payer: Self-pay | Admitting: Physician Assistant

## 2023-03-23 DIAGNOSIS — R42 Dizziness and giddiness: Secondary | ICD-10-CM

## 2023-03-29 ENCOUNTER — Ambulatory Visit (INDEPENDENT_AMBULATORY_CARE_PROVIDER_SITE_OTHER): Admitting: Physician Assistant

## 2023-03-29 ENCOUNTER — Encounter: Payer: Self-pay | Admitting: Physician Assistant

## 2023-03-29 ENCOUNTER — Other Ambulatory Visit (HOSPITAL_COMMUNITY): Payer: Self-pay

## 2023-03-29 VITALS — BP 120/80 | HR 95 | Temp 98.3°F

## 2023-03-29 DIAGNOSIS — R937 Abnormal findings on diagnostic imaging of other parts of musculoskeletal system: Secondary | ICD-10-CM

## 2023-03-29 DIAGNOSIS — R52 Pain, unspecified: Secondary | ICD-10-CM

## 2023-03-29 MED ORDER — DULOXETINE HCL 30 MG PO CPEP
30.0000 mg | ORAL_CAPSULE | Freq: Every day | ORAL | 1 refills | Status: DC
  Filled 2023-03-29: qty 90, 90d supply, fill #0

## 2023-03-29 MED ORDER — PREDNISONE 20 MG PO TABS
ORAL_TABLET | ORAL | 0 refills | Status: DC
  Filled 2023-03-29: qty 30, 30d supply, fill #0

## 2023-03-29 NOTE — Progress Notes (Signed)
 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  History of Present Illness:   Chief Complaint  Patient presents with   Back Pain    Pt c/o low back pain, burning down both legs. Started Jan.   Back Pain Patient complains of lower back pain that has persisted since January with a recent flare-up.   Pain tends to radiate to his legs and cause a burning sensations. States that he often feels as if his legs are "on fire".  Associated symptoms include numbness and tingling in bilateral lower extremities with right side worse than left. Occasional similar symptoms in his arms however it tends to be less severe.   Reports taking methocarbamol 750 mg as needed. Frequently takes pantoprazole regularly due to his GI issues and gastritis.  He reports following up with neurology in January for this, however he was taking some leftover Prednisone 40 mg which he took for 5 days prior to his appointment.  States that medications reduce's severity but doesn't resolve his symptoms.   Recent MRI done on 03-23-23 showed normal appearance of the thoracic cord and cauda equina along with focal L4-5 disc degeneration with protrusion contacting the descending L5 nerve roots at the subarticular recesses.  He is agreeable to trying out Cymbalta at this time, states that he would have compliance difficulty with gabapentin as he would need to take it 3x daily.   Denies any difficulty walking. Not interested in PT at this time due to his busy schedule.   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 Meridee Score   Gastroesophageal reflux disease without esophagitis 02/13/2017   H. pylori infection 04/2019   History of colon polyps 2021   Seasonal allergic rhinitis due to pollen 02/13/2017     Social History   Tobacco Use   Smoking status: Never   Smokeless tobacco: Never  Vaping Use   Vaping status: Never Used  Substance Use Topics   Alcohol use: Not Currently   Drug  use: Never    Past Surgical History:  Procedure Laterality Date   BIOPSY  03/04/2021   Procedure: BIOPSY;  Surgeon: Lemar Lofty., MD;  Location: [facility] ENDOSCOPY;  Service: Gastroenterology;;   [facility] CANALOPLASTY     ESOPHAGOGASTRODUODENOSCOPY (EGD) WITH PROPOFOL N/A 03/04/2021   Procedure: ESOPHAGOGASTRODUODENOSCOPY (EGD) WITH PROPOFOL;  Surgeon: Lemar Lofty., MD;  Location: [facility] ENDOSCOPY;  Service: Gastroenterology;  Laterality: N/A;   EUS N/A 03/04/2021   Procedure: UPPER ENDOSCOPIC ULTRASOUND (EUS) RADIAL;  Surgeon: Lemar Lofty., MD;  Location: [facility] ENDOSCOPY;  Service: Gastroenterology;  Laterality: N/A;   NOSE SURGERY      Family History  Problem Relation Age of Onset   Diabetes Mother    Hypertension Mother    Colon cancer Neg Hx    Esophageal cancer Neg Hx    Stomach cancer Neg Hx    Prostate cancer Neg Hx    Colon polyps Neg Hx    Rectal cancer Neg Hx     Allergies  Allergen Reactions   Procaine Anaphylaxis   Tetanus Toxoids Anaphylaxis    Tetanus Serum    Current Medications:   Current Outpatient Medications:    acetaminophen (TYLENOL) 500 MG tablet, Take 500-1,000 mg by mouth every 6 (six) hours as needed (for pain.)., Disp: , Rfl:    cetirizine (ZYRTEC) 10 MG tablet, Take 10 mg by mouth daily as needed., Disp: , Rfl:    DULoxetine (CYMBALTA)  30 MG capsule, Take 1 capsule (30 mg total) by mouth daily., Disp: 90 capsule, Rfl: 1   fluticasone (FLONASE) 50 MCG/ACT nasal spray, Place 2 sprays into both nostrils as needed. (Patient taking differently: Place 2 sprays into both nostrils daily as needed for allergies.), Disp: 16 g, Rfl: 4   methocarbamol (ROBAXIN) 750 MG tablet, Take 1 tablet (750 mg total) by mouth every 8 (eight) hours as needed for muscle spasms., Disp: 90 tablet, Rfl: 3   omeprazole (PRILOSEC) 20 MG capsule, Take 1 capsule (20 mg total) by mouth daily., Disp: 90 capsule, Rfl: 3   predniSONE (DELTASONE) 20 MG tablet, Take daily as  needed for back pain., Disp: 30 tablet, Rfl: 0   valACYclovir (VALTREX) 1000 MG tablet, Take 2 tablets (2,000 mg total) by mouth 2 (two) times daily  for 1 day and may repeat with future outbreaks (Patient taking differently: Take 2,000 mg by mouth 2 (two) times daily. Fever blisters), Disp: 30 tablet, Rfl: 2   VITAMIN D PO, Take 1 tablet by mouth daily at 6 (six) AM. Takes occasionally, Disp: , Rfl:    Review of Systems:   Review of Systems  Musculoskeletal:  Positive for back pain.       +leg pain   Neurological:  Positive for tingling and tremors.       +burning sensation    Vitals:   Vitals:   03/29/23 1522  BP: 120/80  Pulse: 95  Temp: 98.3 F (36.8 C)  TempSrc: Temporal  SpO2: 98%     There is no height or weight on file to calculate BMI.  Physical Exam:   Physical Exam Vitals and nursing note reviewed.  Constitutional:      Appearance: He is well-developed.  HENT:     Head: Normocephalic.  Eyes:     Conjunctiva/sclera: Conjunctivae normal.     Pupils: Pupils are equal, round, and reactive to light.  Pulmonary:     Effort: Pulmonary effort is normal.  Musculoskeletal:        General: Normal range of motion.     Cervical back: Normal range of motion.  Skin:    General: Skin is warm and dry.  Neurological:     Mental Status: He is alert and oriented to person, place, and time.  Psychiatric:        Behavior: Behavior normal.        Thought Content: Thought content normal.        Judgment: Judgment normal.     Assessment and Plan:   Burning pain; Abnormal MRI, lumbar spine No red flags Recommendations as follows: Original 12 Minutes of Foundation Training with Dr. Myles [email or web address] [email or web address]  2. Start Cymbalta 30 mg daily  3. Prednisone as needed for severe symptom(s)   4. Consider tens unit as needed  Cannot do physical therapy due to schedule Cannot do NSAIDs due to gastrointestinal history   If no improvement -  refer to sports medicine     Jarold Motto, PA-C  I,Safa Hewitt Shorts as a scribe for Jarold Motto, PA.,have documented all relevant documentation on the behalf of Jarold Motto, PA,as directed by  Jarold Motto, PA while in the presence of Jarold Motto, [place].   I, Jarold Motto, [place], have reviewed all documentation for this visit. The documentation on 03/29/23 for the exam, diagnosis, procedures, and orders are all accurate and complete.

## 2023-04-27 ENCOUNTER — Encounter: Payer: Self-pay | Admitting: Physician Assistant

## 2023-04-27 ENCOUNTER — Other Ambulatory Visit: Payer: Self-pay | Admitting: Physician Assistant

## 2023-04-27 ENCOUNTER — Other Ambulatory Visit: Payer: Self-pay

## 2023-04-27 ENCOUNTER — Other Ambulatory Visit (HOSPITAL_COMMUNITY): Payer: Self-pay

## 2023-04-27 ENCOUNTER — Ambulatory Visit: Admitting: Neurology

## 2023-04-27 MED ORDER — GABAPENTIN 100 MG PO CAPS
100.0000 mg | ORAL_CAPSULE | Freq: Three times a day (TID) | ORAL | 3 refills | Status: DC
  Filled 2023-04-27: qty 90, 30d supply, fill #0

## 2023-04-28 ENCOUNTER — Other Ambulatory Visit: Payer: Self-pay | Admitting: Physician Assistant

## 2023-04-28 ENCOUNTER — Other Ambulatory Visit (HOSPITAL_COMMUNITY): Payer: Self-pay

## 2023-04-28 MED ORDER — METHOCARBAMOL 750 MG PO TABS
750.0000 mg | ORAL_TABLET | Freq: Three times a day (TID) | ORAL | 3 refills | Status: DC | PRN
  Filled 2023-04-28: qty 90, 30d supply, fill #0
  Filled 2023-06-23 – 2023-07-13 (×3): qty 90, 30d supply, fill #1

## 2023-04-28 NOTE — Telephone Encounter (Signed)
 Okay to refill Methocarbamol 750 mg every 8 hours prn?

## 2023-05-10 LAB — LIPID PANEL
Cholesterol: 141 (ref 0–200)
HDL: 46 (ref 35–70)
LDL Cholesterol: 85
Triglycerides: 43 (ref 40–160)

## 2023-05-10 LAB — HEPATIC FUNCTION PANEL
ALT: 25 U/L (ref 10–40)
AST: 26 (ref 14–40)
Alkaline Phosphatase: 44 (ref 25–125)
Bilirubin, Total: 0.6

## 2023-05-10 LAB — TSH: TSH: 1.54 (ref 0.41–5.90)

## 2023-05-10 LAB — COMPREHENSIVE METABOLIC PANEL WITH GFR
A1c: 4.9
Albumin: 4.5 (ref 3.5–5.0)
Calcium: 9.1 (ref 8.7–10.7)
Globulin: 2.6
TSH: 1.54 (ref 0.41–5.90)

## 2023-05-10 LAB — CBC AND DIFFERENTIAL
HCT: 42 (ref 41–53)
Hemoglobin: 12.8 — AB (ref 13.5–17.5)
Neutrophils Absolute: 1.2
Platelets: 202 K/uL (ref 150–400)
WBC: 3.1

## 2023-05-10 LAB — BASIC METABOLIC PANEL WITH GFR
CO2: 23 — AB (ref 13–22)
Chloride: 105 (ref 99–108)
Creatinine: 1.1 (ref 0.6–1.3)
Glucose: 98
Potassium: 4.2 meq/L (ref 3.5–5.1)
Sodium: 143 (ref 137–147)

## 2023-05-10 LAB — PSA: PSA: 0.4

## 2023-05-10 LAB — HEMOGLOBIN A1C: Hemoglobin A1C: 4.9

## 2023-05-10 LAB — CBC: RBC: 4.73 (ref 3.87–5.11)

## 2023-07-03 ENCOUNTER — Other Ambulatory Visit (HOSPITAL_COMMUNITY): Payer: Self-pay

## 2023-07-13 ENCOUNTER — Other Ambulatory Visit (HOSPITAL_COMMUNITY): Payer: Self-pay

## 2023-07-13 ENCOUNTER — Encounter: Payer: Self-pay | Admitting: Physician Assistant

## 2023-08-21 ENCOUNTER — Ambulatory Visit (INDEPENDENT_AMBULATORY_CARE_PROVIDER_SITE_OTHER): Admitting: Physician Assistant

## 2023-08-21 ENCOUNTER — Other Ambulatory Visit (HOSPITAL_COMMUNITY): Payer: Self-pay

## 2023-08-21 ENCOUNTER — Encounter: Payer: Self-pay | Admitting: Physician Assistant

## 2023-08-21 VITALS — BP 124/80 | HR 61 | Temp 98.1°F | Ht 71.0 in | Wt 206.4 lb

## 2023-08-21 DIAGNOSIS — H9211 Otorrhea, right ear: Secondary | ICD-10-CM

## 2023-08-21 DIAGNOSIS — B009 Herpesviral infection, unspecified: Secondary | ICD-10-CM | POA: Diagnosis not present

## 2023-08-21 DIAGNOSIS — Z0000 Encounter for general adult medical examination without abnormal findings: Secondary | ICD-10-CM | POA: Diagnosis not present

## 2023-08-21 DIAGNOSIS — Z23 Encounter for immunization: Secondary | ICD-10-CM | POA: Diagnosis not present

## 2023-08-21 DIAGNOSIS — Z125 Encounter for screening for malignant neoplasm of prostate: Secondary | ICD-10-CM

## 2023-08-21 DIAGNOSIS — H9191 Unspecified hearing loss, right ear: Secondary | ICD-10-CM

## 2023-08-21 MED ORDER — METHOCARBAMOL 750 MG PO TABS
750.0000 mg | ORAL_TABLET | Freq: Three times a day (TID) | ORAL | 3 refills | Status: DC | PRN
  Filled 2023-08-21: qty 90, 30d supply, fill #0
  Filled 2023-09-14: qty 90, 30d supply, fill #1
  Filled 2023-10-11: qty 90, 30d supply, fill #2
  Filled 2023-12-21: qty 90, 30d supply, fill #3

## 2023-08-21 MED ORDER — PREDNISONE 20 MG PO TABS
20.0000 mg | ORAL_TABLET | Freq: Every day | ORAL | 0 refills | Status: DC | PRN
  Filled 2023-08-21: qty 30, 30d supply, fill #0

## 2023-08-21 MED ORDER — CIPROFLOXACIN-DEXAMETHASONE 0.3-0.1 % OT SUSP
4.0000 [drp] | Freq: Two times a day (BID) | OTIC | 1 refills | Status: DC
  Filled 2023-08-21: qty 7.5, 19d supply, fill #0

## 2023-08-21 MED ORDER — OMEPRAZOLE 20 MG PO CPDR
20.0000 mg | DELAYED_RELEASE_CAPSULE | Freq: Every day | ORAL | 3 refills | Status: AC
  Filled 2023-08-21 – 2023-10-11 (×4): qty 90, 90d supply, fill #0
  Filled 2023-12-21 – 2023-12-23 (×2): qty 90, 90d supply, fill #1

## 2023-08-21 MED ORDER — VALACYCLOVIR HCL 1 G PO TABS
2000.0000 mg | ORAL_TABLET | Freq: Two times a day (BID) | ORAL | 2 refills | Status: AC
  Filled 2023-08-21: qty 30, 8d supply, fill #0

## 2023-08-21 NOTE — Progress Notes (Signed)
 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  HPI  There are no preventive care reminders to display for this patient.  Acute Concerns: None  Chronic Issues: Discussed the use of AI scribe software for clinical note transcription with the patient, who gave verbal consent to proceed.  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  He has chronic back pain that has worsened recently due to increased physical activity and travel. The pain improved after performing back exercises and playing pickleball in early July but worsened following long drives and physical activities, including moving a refrigerator. He has been on and off steroids, which he believes contributed to a weight gain of about three pounds since his last physical.  He experiences episodes of lightheadedness, dizziness, and vertigo, which occur intermittently. He describes periods of being symptom-free followed by weeks of frequent episodes. He previously wore a heart monitor for two weeks, which did not capture any significant events. He has a history of hypoglycemia, with an A1c of 4.9, and experiences occasional episodes of feeling 'funny' but attributes these to situational factors rather than consistent hypoglycemia.  He has chronic issues with his right ear, including drainage and decreased hearing. He reports a history of tympanoplasty, which was not successful, and has been on amoxicillin  twice this year for ear infections. He uses Q-tips to manage drainage and has considered further ENT evaluation.  He has tried medications such as Cymbalta  and gabapentin  for his symptoms. He experienced significant side effects with Cymbalta , feeling like a 'zombie' after one dose, and found gabapentin  more tolerable but still not ideal. He takes gabapentin  occasionally at  night when symptoms are severe.  He reports a stable LDL level of 85, with previous levels ranging between 79 and 84. His PSA has decreased slightly, and his A1c remains stable despite concerns about steroid use.  He is considering reducing his work hours to part-time to better manage family responsibilities, as he has three children with varying school schedules. His wife works part-time as a Licensed conveyancer, and he coordinates his schedules to manage childcare. No current headaches or alcohol use. Occasional snoring, attributed to weight gain. A sleep study ruled out sleep apnea.    Health Maintenance: Immunizations -- getting pneumonia vaccine(s) today -- will consider shingles next year Colonoscopy -- UpToDate; due in 2028 (7 year recall) PSA --  Lab Results  Component Value Date   PSA 0.5 03/29/2022   PSA 0.36 03/25/2019   PSA 0.36 02/13/2017   Diet -- overall healthy Sleep habits -- no major concerns Exercise -- regular exercise  Weight -- Weight: 206 lb 6.1 oz (93.6 kg)  Recent weight history Wt Readings from Last 10 Encounters:  08/21/23 206 lb 6.1 oz (93.6 kg)  02/01/23 205 lb (93 kg)  12/21/22 200 lb (90.7 kg)  11/24/22 200 lb (90.7 kg)  07/20/22 203 lb 6.1 oz (92.3 kg)  03/28/22 199 lb (90.3 kg)  08/23/21 205 lb 4 oz (93.1 kg)  11/03/20 200 lb (90.7 kg)  11/28/19 207 lb (93.9 kg)  11/19/19 207 lb 4 oz (94 kg)   Body mass index is 28.78 kg/m.  Mood -- stable Alcohol use --  reports that he does not currently use alcohol.  Tobacco use --  Tobacco Use: Low Risk  (08/21/2023)   Patient History  Smoking Tobacco Use: Never    Smokeless Tobacco Use: Never    Passive Exposure: Not on file    Eligible for Low Dose CT? no  UTD with eye doctor? yes UTD with dentist? yes     08/21/2023   11:00 AM  Depression screen PHQ 2/9  Decreased Interest 0  Down, Depressed, Hopeless 0  PHQ - 2 Score 0    Other providers/specialists: Patient Care Team: Job Lukes, [place]  as PCP - General (Physician Assistant)    PMHx, SurgHx, SocialHx, Medications, and Allergies were reviewed in the Visit Navigator and updated as appropriate.   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 Wilhelmenia   Gastroesophageal reflux disease without esophagitis 02/13/2017   H. pylori infection 04/2019   History of colon polyps 2021   Seasonal allergic rhinitis due to pollen 02/13/2017     Past Surgical History:  Procedure Laterality Date   BIOPSY  03/04/2021   Procedure: BIOPSY;  Surgeon: Wilhelmenia Aloha Raddle., MD;  [facility];  Service: Gastroenterology;;   [facility]IE CANALOPLASTY     ESOPHAGOGASTRODUODENOSCOPY (EGD) WITH PROPOFOL  N/A 03/04/2021   Procedure: ESOPHAGOGASTRODUODENOSCOPY (EGD) WITH PROPOFOL ;  Surgeon: Wilhelmenia Aloha Raddle., MD;  Location: [facility] ENDOSCOPY;  Service: Gastroenterology;  Laterality: N/A;   EUS N/A 03/04/2021   Procedure: UPPER ENDOSCOPIC ULTRASOUND (EUS) RADIAL;  Surgeon: Wilhelmenia Aloha Raddle., MD;  Location: [facility] ENDOSCOPY;  Service: Gastroenterology;  Laterality: N/A;   NOSE SURGERY       Family History  Problem Relation Age of Onset   Diabetes Mother    Hypertension Mother    Colon cancer Neg Hx    Esophageal cancer Neg Hx    Stomach cancer Neg Hx    Prostate cancer Neg Hx    Colon polyps Neg Hx    Rectal cancer Neg Hx     Social History   Tobacco Use   Smoking status: Never   Smokeless tobacco: Never  Vaping Use   Vaping status: Never Used  Substance Use Topics   Alcohol use: Not Currently   Drug use: Never    Review of Systems:   Review of Systems  Constitutional:  Negative for chills, fever, malaise/fatigue and weight loss.  HENT:  Negative for hearing loss, sinus pain and sore throat.   Respiratory:  Negative for cough and hemoptysis.   Cardiovascular:  Negative for chest pain, palpitations, leg swelling and PND.  Gastrointestinal:  Negative for abdominal pain, constipation, diarrhea,  heartburn, nausea and vomiting.  Genitourinary:  Negative for dysuria, frequency and urgency.  Musculoskeletal:  Negative for back pain, myalgias and neck pain.  Skin:  Negative for itching and rash.  Neurological:  Negative for dizziness, tingling, seizures and headaches.  Endo/Heme/Allergies:  Negative for polydipsia.  Psychiatric/Behavioral:  Negative for depression. The patient is not nervous/anxious.     Objective:    Vitals:   08/21/23 1103  BP: 124/80  Pulse: 61  Temp: 98.1 F (36.7 C)  SpO2: 98%    Body mass index is 28.78 kg/m.  General  Alert, cooperative, no distress, appears stated age  Head:  Normocephalic, without obvious abnormality, atraumatic  Eyes:  PERRL, conjunctiva/corneas clear, EOM's intact, fundi benign, both eyes       Ears:  Normal TM's and external ear canals, both ears  Nose: Nares normal, septum midline, mucosa normal, no drainage or sinus tenderness  Throat: Lips, mucosa, and tongue normal; teeth and gums normal  Neck: Supple, symmetrical, trachea  midline, no adenopathy;     thyroid :  No enlargement/tenderness/nodules; no carotid bruit or JVD  Back:   Symmetric, no curvature, ROM normal, no CVA tenderness  Lungs:   Clear to auscultation bilaterally, respirations unlabored  Chest wall:  No tenderness or deformity  Heart:  Regular rate and rhythm, S1 and S2 normal, no murmur, rub or gallop  Abdomen:   Soft, non-tender, bowel sounds active all four quadrants, no masses, no organomegaly  Extremities: Extremities normal, atraumatic, no cyanosis or edema  Prostate : Deferred   Skin: Skin color, texture, turgor normal, no rashes or lesions  Lymph nodes: Cervical, supraclavicular, and axillary nodes normal  Neurologic: CNII-XII grossly intact. Normal strength, sensation and reflexes throughout   AssessmentPlan:   Assessment and Plan Assessment & Plan Adult Wellness Visit Recent blood work discrepancies noted. LDL at 85, A1c at 4.9 despite  prednisone  use. Weight increased by 3 pounds, likely due to back issues and steroid use. PSA decreased. Discussed family dynamics and work schedule changes. - Administer pneumonia vaccine today. - Defer shingles vaccine to next year. - Schedule physicals on Fridays for convenience. - Ensure dental and eye exams are up to date. - Colonoscopy due in 2028.  Chronic low back pain Exacerbated by recent travel and activity. Previous improvement with Floy exercises and prednisone . Discussed gabapentin  use at night if pain worsens. Epidural injections considered but not preferred. - Continue Goodman back exercises. - Consider gabapentin  at night if pain worsens. - Evaluate need for epidural injections with IR if pain persists.  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 Previous tympanoplasty unsuccessful due to delayed follow-up. Discussed Ciprodex  drops for otorrhea instead of systemic antibiotics. - Refer to ENT for evaluation and potential tympanoplasty. - Prescribe Ciprodex  drops for otorrhea management.  Recurrent lightheadedness and dizziness Intermittent episodes with no clear etiology. Previous investigations unremarkable. Episodes are sporadic and not life-threatening. He has decided to live with the condition. He will reach out if/when new issues arise.  Overweight Weight gain due to decreased activity from back pain and steroid use. Discussed benefits of increased activity if back pain is managed. - Encourage weight management through increased activity as back pain allows.     Lucie Buttner, PA-C [facility]

## 2023-08-21 NOTE — Patient Instructions (Addendum)
 It was great to see you!    Take care,  Latonga Ponder    Body mass index is 28.78 kg/m.

## 2023-08-23 ENCOUNTER — Encounter: Payer: Self-pay | Admitting: Physician Assistant

## 2023-08-23 ENCOUNTER — Other Ambulatory Visit (HOSPITAL_BASED_OUTPATIENT_CLINIC_OR_DEPARTMENT_OTHER): Payer: Self-pay

## 2023-09-06 ENCOUNTER — Other Ambulatory Visit (HOSPITAL_COMMUNITY): Payer: Self-pay

## 2023-09-14 ENCOUNTER — Other Ambulatory Visit: Payer: Self-pay

## 2023-09-14 ENCOUNTER — Other Ambulatory Visit (HOSPITAL_COMMUNITY): Payer: Self-pay

## 2023-09-16 ENCOUNTER — Other Ambulatory Visit (HOSPITAL_COMMUNITY): Payer: Self-pay

## 2023-09-17 ENCOUNTER — Other Ambulatory Visit: Payer: Self-pay

## 2023-09-19 ENCOUNTER — Encounter: Admitting: Physician Assistant

## 2023-09-19 ENCOUNTER — Other Ambulatory Visit: Payer: Self-pay

## 2023-10-10 ENCOUNTER — Encounter: Payer: Self-pay | Admitting: Gastroenterology

## 2023-10-11 ENCOUNTER — Encounter: Payer: Self-pay | Admitting: Physician Assistant

## 2023-10-11 DIAGNOSIS — M545 Low back pain, unspecified: Secondary | ICD-10-CM

## 2023-10-12 ENCOUNTER — Other Ambulatory Visit: Payer: Self-pay

## 2023-10-12 ENCOUNTER — Other Ambulatory Visit (HOSPITAL_COMMUNITY): Payer: Self-pay

## 2023-10-13 NOTE — Progress Notes (Signed)
 I, Leotis Batter, CMA acting as a scribe for Artist Lloyd, MD.  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 Pt locates pain to left lower back radiating into the left leg to the calf. Denies weakness but endorses n/t. Sx worsening since July 22, after slipping on a wet spot while moving the refrigerator.  He has been doing home exercise program dedicated for low back pain ongoing since around March of this year.  This has been directed by a medical provider.  Radiating pain: L LE LE numbness/tingling: L LE, calf LE weakness: denies Aggravates: bending Treatments tried: [employer] training, tylenol, IBU, methocarbamol    Dx imaging: 03/20/23 L-spine & T-spine MRI  Pertinent review of systems: No fevers or chills  Relevant historical information: Lumbar spinal stenosis seen on MRI lumbar spine March 2025.  Patient works as a Licensed conveyancer   Exam:  BP 130/88   Pulse (!) 58   Ht 5' 11 (1.803 m)   Wt 206 lb (93.4 kg)   SpO2 99%   BMI 28.73 kg/m  General: Well Developed, well nourished, and in no acute distress.   MSK: L-spine normal-appearing Intact lumbar range of motion. Lower extremity strength is intact. Reflexes are intact.    Lab and Radiology Results  EXAM: MRI THORACIC AND LUMBAR SPINE WITHOUT AND WITH CONTRAST   TECHNIQUE: Multiplanar and multiecho pulse sequences of the thoracic and lumbar spine were obtained without and with intravenous contrast.   CONTRAST:  9 cc of vueway  intravenous   COMPARISON:  None Available.   FINDINGS: MRI THORACIC SPINE FINDINGS   Alignment:  Mildly exaggerated thoracic kyphosis.   Vertebrae: No fracture, evidence of discitis, or bone lesion.   Cord:  Normal signal and morphology.   Paraspinal and other soft tissues: Negative.   Disc levels:   Diffusely preserved disc height and hydration. No significant endplate and  facet spurring.   MRI LUMBAR SPINE FINDINGS   Segmentation:  Standard.   Alignment:  Straightening of lumbar lordosis   Vertebrae: No fracture, evidence of discitis, or aggressive bone lesion. Non worrisome hemangioma in the S1 body.   Conus medullaris: Extends to the T12-L1 level and appears normal.   Paraspinal and other soft tissues: Unremarkable   Disc levels:   Focal L4-5 disc degeneration with disc height loss and desiccation and endplate degeneration. There is a central protrusion contacting both descending L5 nerve roots. Foraminal disc bulging causes moderate left foraminal narrowing by sagittal images but more mild appearing on axial precontrast T1 weighted imaging. Patent right foramen.   IMPRESSION: 1. Normal appearance of the thoracic cord and cauda equina. 2. Focal L4-5 disc degeneration with protrusion contacting the descending L5 nerve roots at the subarticular recesses. Mild to moderate left foraminal narrowing at the same level.     Electronically Signed   By: Dorn Roulette M.D.   On: 03/23/2023 08:19. I, Artist Lloyd, personally (independently) visualized and performed the interpretation of the images attached in this note.       Assessment and Plan: 53 y.o. male with lumbar radiculopathy primarily targeting the left L5 nerve root.  Left S1 could be involved as well.  Plan for epidural steroid injection.  Consider formal physical therapy or updated imaging if needed.   PDMP not reviewed this encounter. Orders Placed This Encounter  Procedures   DG INJECT DIAG/THERA/INC NEEDLE/CATH/PLC EPI/LUMB/SAC W/IMG    Level and  technique per radiology    Standing Status:   Future    Expiration Date:   11/15/2023    Reason for Exam (SYMPTOM  OR DIAGNOSIS REQUIRED):   Low back pain    Preferred Imaging Location?:   [facility]   No orders of the defined types were placed in this encounter.    Discussed warning signs or symptoms. Please see  discharge instructions. Patient expresses understanding.   The above documentation has been reviewed and is accurate and complete Artist Lloyd, M.D.

## 2023-10-16 ENCOUNTER — Encounter: Payer: Self-pay | Admitting: Family Medicine

## 2023-10-16 ENCOUNTER — Ambulatory Visit (INDEPENDENT_AMBULATORY_CARE_PROVIDER_SITE_OTHER): Admitting: Family Medicine

## 2023-10-16 VITALS — BP 130/88 | HR 58 | Ht 71.0 in | Wt 206.0 lb

## 2023-10-16 DIAGNOSIS — M5416 Radiculopathy, lumbar region: Secondary | ICD-10-CM

## 2023-10-16 NOTE — Patient Instructions (Addendum)
 Thank you for coming in today.   We've placed an order for a back injection, someone should reach out to assist with scheduling after obtained authorization from your insurance company.   See you back as needed.

## 2023-10-16 NOTE — Discharge Instructions (Signed)

## 2023-10-17 ENCOUNTER — Ambulatory Visit
Admission: RE | Admit: 2023-10-17 | Discharge: 2023-10-17 | Disposition: A | Discharge status: Home / Self Care | Source: Ambulatory Visit | Attending: Family Medicine | Admitting: Family Medicine

## 2023-10-17 ENCOUNTER — Inpatient Hospital Stay
Admission: RE | Admit: 2023-10-17 | Discharge: 2023-10-17 | Disposition: A | Discharge status: Home / Self Care | Source: Ambulatory Visit | Attending: Family Medicine | Admitting: Family Medicine

## 2023-10-17 ENCOUNTER — Other Ambulatory Visit: Payer: Self-pay | Admitting: Family Medicine

## 2023-10-17 DIAGNOSIS — M5416 Radiculopathy, lumbar region: Secondary | ICD-10-CM

## 2023-10-17 DIAGNOSIS — M5126 Other intervertebral disc displacement, lumbar region: Secondary | ICD-10-CM | POA: Diagnosis not present

## 2023-10-17 MED ORDER — METHYLPREDNISOLONE ACETATE 40 MG/ML INJ SUSP (RADIOLOG
80.0000 mg | Freq: Once | INTRAMUSCULAR | Status: AC
  Administered 2023-10-17: 80 mg via EPIDURAL

## 2023-10-17 MED ORDER — IOPAMIDOL (ISOVUE-M 200) INJECTION 41%
1.0000 mL | Freq: Once | INTRAMUSCULAR | Status: AC
  Administered 2023-10-17: 1 mL via EPIDURAL

## 2023-10-17 NOTE — Discharge Instructions (Signed)

## 2023-10-30 ENCOUNTER — Encounter (INDEPENDENT_AMBULATORY_CARE_PROVIDER_SITE_OTHER): Payer: Self-pay | Admitting: Otolaryngology

## 2023-10-30 ENCOUNTER — Ambulatory Visit (INDEPENDENT_AMBULATORY_CARE_PROVIDER_SITE_OTHER): Admitting: Otolaryngology

## 2023-10-30 VITALS — BP 117/80 | HR 74 | Temp 98.2°F | Ht 71.0 in | Wt 202.0 lb

## 2023-10-30 DIAGNOSIS — H7292 Unspecified perforation of tympanic membrane, left ear: Secondary | ICD-10-CM | POA: Diagnosis not present

## 2023-10-30 DIAGNOSIS — H9012 Conductive hearing loss, unilateral, left ear, with unrestricted hearing on the contralateral side: Secondary | ICD-10-CM | POA: Diagnosis not present

## 2023-10-30 DIAGNOSIS — H7202 Central perforation of tympanic membrane, left ear: Secondary | ICD-10-CM | POA: Insufficient documentation

## 2023-10-30 DIAGNOSIS — H6692 Otitis media, unspecified, left ear: Secondary | ICD-10-CM

## 2023-10-30 NOTE — Progress Notes (Signed)
 CC: Left tympanic membranes perforation, recurrent left ear infection  Discussed the use of AI scribe software for clinical note transcription with the patient, who gave verbal consent to proceed.  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  He has a long-standing history of otitis media since infancy, which initially led to a ruptured tympanic membrane in the left ear. Over the years, he has experienced recurrent infections in the left ear, characterized by purulent discharge. These infections have restricted his ability to participate in water activities due to the risk of exacerbating the condition.  In 2016, he underwent a tympanoplasty at [facility]. However, the follow-up was delayed due to the surgeon's vacation, and an attempt to remove post-surgical material resulted in reopening the perforation. Since then, he has continued to experience intermittent infections, with purulent discharge occurring at least twice in the past year.  He has been using ear drops prescribed by his primary care provider, which have been effective in managing the infections. He still experiences issues when water enters the ear.  He reports hearing loss in the left ear, with a previous hearing test indicating a 30-40% loss on that side. He compensates by increasing the volume of the television and has not used hearing aids. He denies current dizziness.     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 Wilhelmenia   Gastroesophageal reflux disease without esophagitis 02/13/2017   H. pylori infection 04/2019   History of colon polyps 2021   Seasonal allergic rhinitis due to pollen 02/13/2017    Past Surgical History:  Procedure Laterality Date   BIOPSY  03/04/2021   Procedure: BIOPSY;  Surgeon: Wilhelmenia Aloha Raddle., MD;   Location: [facility] ENDOSCOPY;  Service: Gastroenterology;;   MARVIE CANALOPLASTY     ESOPHAGOGASTRODUODENOSCOPY (EGD) WITH PROPOFOL  N/A 03/04/2021   Procedure: ESOPHAGOGASTRODUODENOSCOPY (EGD) WITH PROPOFOL ;  Surgeon: Wilhelmenia Aloha Raddle., MD;  Location: [facility] ENDOSCOPY;  Service: Gastroenterology;  Laterality: N/A;   EUS N/A 03/04/2021   Procedure: UPPER ENDOSCOPIC ULTRASOUND (EUS) RADIAL;  Surgeon: Wilhelmenia Aloha Raddle., MD;  Location: [facility] ENDOSCOPY;  Service: Gastroenterology;  Laterality: N/A;   NOSE SURGERY      Family History  Problem Relation Age of Onset   Diabetes Mother    Hypertension Mother    Colon cancer Neg Hx    Esophageal cancer Neg Hx    Stomach cancer Neg Hx    Prostate cancer Neg Hx    Colon polyps Neg Hx    Rectal cancer Neg Hx     Social History:  reports that he has never smoked. He has never used smokeless tobacco. He reports that he does not currently use alcohol. He reports that he does not use drugs.  Allergies:  Allergies  Allergen Reactions   Procaine Anaphylaxis   Tetanus Toxoid-Containing Vaccines Anaphylaxis    Tetanus Serum    Prior to Admission medications   Medication Sig Start Date End Date Taking? Authorizing Provider  acetaminophen (TYLENOL) 500 MG tablet Take 500-1,000 mg by mouth every 6 (six) hours as needed (for pain.).   Yes [provider]  cetirizine (ZYRTEC) 10 MG tablet Take 10 mg by mouth daily as needed.   Yes [provider]  fluticasone  (FLONASE ) 50 MCG/ACT nasal spray Place 2 sprays into both nostrils as needed. Patient taking differently: Place 2  sprays into both nostrils daily as needed for allergies. 11/19/19  Yes Job Lukes, PA  gabapentin  (NEURONTIN ) 100 MG capsule Take 1 capsule (100 mg total) by mouth 3 (three) times daily. 04/27/23  Yes Job Lukes, PA  methocarbamol  (ROBAXIN ) 750 MG tablet Take 1 tablet (750 mg total) by mouth every 8 (eight) hours as needed for muscle spasms. 08/21/23  Yes Job Lukes, PA  omeprazole  (PRILOSEC) 20 MG capsule Take 1 capsule (20 mg total) by mouth daily. 08/21/23  Yes Job Lukes, PA  predniSONE  (DELTASONE ) 20 MG tablet Take 1 tablet (20 mg total) by mouth daily as needed for back pain 08/21/23  Yes Job Lukes, PA  valACYclovir  (VALTREX ) 1000 MG tablet Take 2 tablets (2,000 mg total) by mouth 2 (two) times daily  for 1 day and may repeat with future outbreaks 08/21/23  Yes Job Lukes, PA  VITAMIN D PO Take 1 tablet by mouth daily at 6 (six) AM. Takes occasionally   Yes [provider]    Blood pressure 117/80, pulse 74, temperature 98.2 F (36.8 C), temperature source Oral, height 5' 11 (1.803 m), weight 202 lb (91.6 kg), SpO2 96%. Exam: General: Communicates without difficulty, well nourished, no acute distress. Head: Normocephalic, no evidence injury, no tenderness, facial buttresses intact without stepoff. Face/sinus: No tenderness to palpation and percussion. Facial movement is normal and symmetric. Eyes: PERRL, EOMI. No scleral icterus, conjunctivae clear. Neuro: CN II exam reveals vision grossly intact.  No nystagmus at any point of gaze. Ears: Auricles well formed without lesions.  Ear canals are intact without mass or lesion.  No erythema or edema is appreciated.  A 15% left tympanic membrane perforation is noted.  The right tympanic membrane is intact and mobile.  Nose: External evaluation reveals normal support and skin without lesions.  Dorsum is intact.  Anterior rhinoscopy reveals congested mucosa over anterior aspect of inferior turbinates and intact septum.  No purulence noted. Oral:  Oral cavity and oropharynx are intact, symmetric, without erythema or edema.  Mucosa is moist without lesions. Neck: Full range of motion without pain.  There is no significant lymphadenopathy.  No masses palpable.  Thyroid  bed within normal limits to palpation.  Parotid glands and submandibular glands equal bilaterally without mass.  Trachea is  midline. Neuro:  CN 2-12 grossly intact.   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 The perforation is currently  approximately 15% of the eardrum, and the middle ear space is dry. - The treatment options are extensively discussed.  Options include continuing conservative observation versus surgical intervention with revision left tympanoplasty.  The risk, benefits, alternatives, and details of the procedure are extensively discussed.  Questions are invited and answered. - Avoid water exposure to the left ear, especially untreated water. - The patient would like to proceed with the procedure.  Recurrent left otitis media Recurrent left otitis media with pus discharge, occurring at least twice this year. Currently, there is no active infection, and the middle ear space is dry. Previous infections were managed with antibiotic ear drops. - Avoid water exposure to prevent infections - Treat with antibiotic ear drops if signs of infection occur  Left conductive hearing loss Left conductive hearing loss with 30-40% hearing loss on the left side, likely due to chronic infections and tympanic membrane perforation. He compensates by increasing the volume of the television and being mindful of  speaking volume. - Consider hearing aids if hearing loss becomes more problematic    Shawn Franco W Maxine Fredman 10/30/2023, 2:10 PM

## 2023-11-06 ENCOUNTER — Other Ambulatory Visit: Payer: Self-pay

## 2023-11-06 ENCOUNTER — Emergency Department

## 2023-11-06 ENCOUNTER — Emergency Department
Admission: EM | Admit: 2023-11-06 | Discharge: 2023-11-06 | Disposition: A | Discharge status: Home / Self Care | Attending: Emergency Medicine | Admitting: Emergency Medicine

## 2023-11-06 DIAGNOSIS — M5116 Intervertebral disc disorders with radiculopathy, lumbar region: Secondary | ICD-10-CM | POA: Diagnosis not present

## 2023-11-06 DIAGNOSIS — M79675 Pain in left toe(s): Secondary | ICD-10-CM | POA: Diagnosis present

## 2023-11-06 DIAGNOSIS — M5442 Lumbago with sciatica, left side: Secondary | ICD-10-CM | POA: Diagnosis not present

## 2023-11-06 DIAGNOSIS — R29898 Other symptoms and signs involving the musculoskeletal system: Secondary | ICD-10-CM | POA: Insufficient documentation

## 2023-11-06 DIAGNOSIS — M545 Low back pain, unspecified: Secondary | ICD-10-CM | POA: Diagnosis not present

## 2023-11-06 DIAGNOSIS — M48061 Spinal stenosis, lumbar region without neurogenic claudication: Secondary | ICD-10-CM | POA: Diagnosis not present

## 2023-11-06 DIAGNOSIS — M5432 Sciatica, left side: Secondary | ICD-10-CM

## 2023-11-06 NOTE — ED Notes (Signed)
 Attempted to triage pt. PT refusing until after going to the bathroom.

## 2023-11-06 NOTE — Discharge Instructions (Addendum)
 Your evaluated in the ED for unilateral leg weakness.  Your MRI of the lumbar reveals left subarticular disc extrusion at L4-5 causing lateral recess narrowing and displacement of the transversing nerve roots, compatible with left L4 or L5 radiculopathy.  There is no neuroforaminal stenosis.  Please follow-up with neurosurgery for further evaluation.  Call and schedule appointment tomorrow.  If any new or worsening symptoms occur such as loss of bowel or bladder control and status anesthesia please return to ED for further evaluation.

## 2023-11-06 NOTE — ED Provider Notes (Signed)
 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  Patient has a history of chronic sciatica pain affecting his left side.  He states left leg is  buckling now due to the sciatica pain.  He reports he has taken multiple prednisone 's with no relief.  He reports recent epidural injection for pain in L5.  Patient reports pain has improved since last night to now a 2/10. he denies saddle anesthesia, loss of bowel or bladder control.  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  He does not currently follow neurosurgery.  No other complaint.     Physical Exam   Triage Vital Signs: ED Triage Vitals  Encounter Vitals Group     BP 11/06/23 1631 (!) 142/97     Girls Systolic BP Percentile --      Girls Diastolic BP Percentile --      Boys Systolic BP Percentile --      Boys Diastolic BP Percentile --      Pulse Rate 11/06/23 1631 99     Resp 11/06/23 1631 18     Temp 11/06/23 1631 98.3 F (36.8 C)     Temp src --      SpO2 11/06/23 1631 100 %     Weight 11/06/23 1632 201 lb 15.1 oz (91.6 kg)     Height 11/06/23 1632 5' 11 (1.803 m)     Head Circumference --      Peak Flow --      Pain Score 11/06/23 1631 2     Pain Loc --      Pain Education --      Exclude from Growth Chart --     Most recent vital signs: Vitals:   11/06/23 2006 11/06/23 2240  BP: 125/83 (!) 142/74  Pulse: 80 (!) 59  Resp: 16 16  Temp:    SpO2: 99% 99%    General: Well appearing and comfortable. Alert and oriented. INAD.  Neck:   No cervical spine tenderness to palpation. Full ROM without difficulty.  CV:  Good peripheral perfusion. No  peripheral edema.  RESP:  Normal effort. BACK:  Spinous process is midline without deformity or tenderness. NEURO: Cranial nerves intact. No focal deficits. Speech clear. Sensation and motor function intact. R LE > L LE muscle strength. R great toe strength > L great toe strength. Gait is fairly unsteady as patient favors left leg.   ED Results / Procedures / Treatments   Labs (all labs ordered are listed, but only abnormal results are displayed) Labs Reviewed - No data to display  RADIOLOGY  I personally viewed and evaluated these images as part of my medical decision making, as well as reviewing the written report by the radiologist.  MR LUMBAR SPINE WO CONTRAST Result Date: 11/06/2023 EXAM: MRI LUMBAR SPINE 11/06/2023 09:19:15 PM TECHNIQUE: Multiplanar multisequence MRI of the lumbar spine was performed without the administration of intravenous contrast. COMPARISON: 03/20/2023 CLINICAL HISTORY: Lumbar radiculopathy, symptoms persist with > 6 wks treatment. Per RN: Pt comes in via pov with complaints of left leg weakness that  started to day. Pt has a history of chronic sciatic back pain, and was up most of the night with pain in his lower back. Pt states that the leg is buckling now due to the sciatic nerve pain. Pt complains of lower back pain 2/10 at this time. FINDINGS: BONES AND ALIGNMENT: Normal alignment. Normal vertebral body heights. Bone marrow signal is unremarkable. SPINAL CORD: The conus terminates normally. SOFT TISSUES: No paraspinal mass. L1-L2: No significant disc herniation. No spinal canal stenosis or neural foraminal narrowing. L2-L3: No significant disc herniation. No spinal canal stenosis or neural foraminal narrowing. L3-L4: No significant disc herniation. No spinal canal stenosis or neural foraminal narrowing. L4-L5: Intermediate-sized disc bulge with new superimposed left subarticular protrusion extrusion with superior migration to the L4 pedicle level. This severely  narrows the left lateral recess and displaces the associated nerve roots. This could contribute to left L4 or L5 radiculopathy. There is no neural foraminal stenosis. Mild central spinal canal stenosis. L5-S1: No significant disc herniation. No spinal canal stenosis or neural foraminal narrowing. IMPRESSION: 1. Left subarticular disc extrusion at L4-5 with superior migration to the L4 pedicle level, causing severe left lateral recess narrowing and displacement of the traversing nerve roots, compatible with left L4 or L5 radiculopathy. 2. No neural foraminal stenosis. Electronically signed by: Franky Stanford MD 11/06/2023 09:56 PM EDT RP Workstation: [id number]    PROCEDURES:  Critical Care performed: No  Procedures   MEDICATIONS ORDERED IN ED: Medications - No data to display   IMPRESSION / MDM / ASSESSMENT AND PLAN / ED COURSE  I reviewed the triage vital signs and the nursing notes.                              Clinical Course as of 11/07/23 0001  Mon Nov 06, 2023  2230 MR LUMBAR SPINE WO CONTRAST IMPRESSION: 1. Left subarticular disc extrusion at L4-5 with superior migration to the L4 pedicle level, causing severe left lateral recess narrowing and displacement of the traversing nerve roots, compatible with left L4 or L5 radiculopathy. 2. No neural foraminal stenosis.   [MH]    Clinical Course User Index [MH] Margrette Monte A, PA-C    53 y.o. male presents to the emergency department for evaluation and treatment of acute left lower extremity weakness. See HPI for further details.   Differential diagnosis includes, but is not limited to radiculopathy, disc herniation, cord compression, cauda equina considered but less likely  Patient's presentation is most consistent with acute complicated illness / injury requiring diagnostic workup.  Patient is alert and oriented.  He is hemodynamic stable.  Fairly concerning neuroexam as patient's left side is significantly weaker compared  to right, indicated further workup with lumbar MRI.  Discussed case with supervising physician Dr. Claudene who is in agreement with this plan.  MRI reveals left subarticular disc extrusion at L4-5 with superior migration to L4 causing severe left lateral recess narrowing and displacement of the transversing nerve root compatible with left L4 or L5 radiculopathy.  Will refer patient to neurosurgery for further evaluation.  He is in agreement with this care plan.  Patient stable condition for discharge home.   FINAL CLINICAL IMPRESSION(S) / ED DIAGNOSES   Final diagnoses:  Transient left leg weakness  Sciatica of left side   Rx / DC Orders   ED Discharge Orders     None        Note:  This document  was prepared using Conservation officer, historic buildings and may include unintentional dictation errors.    Margrette, Willowdean Luhmann A, PA-C 11/07/23 0001    Claudene Rover, MD 11/07/23 [phone]

## 2023-11-06 NOTE — ED Notes (Signed)
 Pt returned from MRI at this time

## 2023-11-06 NOTE — ED Notes (Signed)
 Pt endorses completely numb in left hallux.

## 2023-11-06 NOTE — ED Triage Notes (Signed)
 Pt comes in via pov with complaints of left leg weakness that started to day. Pt has a history of chronic sciatic back pain, and was up most of the night with pain in his lower back. Pt states that the leg is buckling now due to the sciatic nerve pain. Pt complains of lower back pain 2/10 at this time.

## 2023-11-07 ENCOUNTER — Telehealth (INDEPENDENT_AMBULATORY_CARE_PROVIDER_SITE_OTHER): Payer: Self-pay | Admitting: Neurosurgery

## 2023-11-07 ENCOUNTER — Ambulatory Visit: Admission: EM | Admit: 2023-11-07 | Discharge: 2023-11-07 | Disposition: A | Discharge status: Home / Self Care

## 2023-11-07 ENCOUNTER — Emergency Department: Admitting: Certified Registered"

## 2023-11-07 ENCOUNTER — Encounter: Payer: Self-pay | Admitting: Emergency Medicine

## 2023-11-07 ENCOUNTER — Other Ambulatory Visit: Payer: Self-pay

## 2023-11-07 ENCOUNTER — Encounter: Admission: EM | Disposition: A | Discharge status: Home / Self Care | Payer: Self-pay | Source: Home / Self Care

## 2023-11-07 ENCOUNTER — Emergency Department

## 2023-11-07 ENCOUNTER — Encounter: Payer: Self-pay | Admitting: Family Medicine

## 2023-11-07 ENCOUNTER — Ambulatory Visit: Payer: Self-pay | Admitting: Neurosurgery

## 2023-11-07 DIAGNOSIS — G709 Myoneural disorder, unspecified: Secondary | ICD-10-CM | POA: Diagnosis not present

## 2023-11-07 DIAGNOSIS — Z79899 Other long term (current) drug therapy: Secondary | ICD-10-CM | POA: Insufficient documentation

## 2023-11-07 DIAGNOSIS — M5126 Other intervertebral disc displacement, lumbar region: Secondary | ICD-10-CM

## 2023-11-07 DIAGNOSIS — Z01818 Encounter for other preprocedural examination: Secondary | ICD-10-CM | POA: Diagnosis not present

## 2023-11-07 DIAGNOSIS — M5416 Radiculopathy, lumbar region: Secondary | ICD-10-CM

## 2023-11-07 DIAGNOSIS — M48061 Spinal stenosis, lumbar region without neurogenic claudication: Secondary | ICD-10-CM | POA: Diagnosis not present

## 2023-11-07 DIAGNOSIS — K219 Gastro-esophageal reflux disease without esophagitis: Secondary | ICD-10-CM | POA: Insufficient documentation

## 2023-11-07 DIAGNOSIS — G8929 Other chronic pain: Secondary | ICD-10-CM | POA: Insufficient documentation

## 2023-11-07 DIAGNOSIS — R29898 Other symptoms and signs involving the musculoskeletal system: Secondary | ICD-10-CM

## 2023-11-07 DIAGNOSIS — M5116 Intervertebral disc disorders with radiculopathy, lumbar region: Secondary | ICD-10-CM | POA: Diagnosis not present

## 2023-11-07 DIAGNOSIS — Z0189 Encounter for other specified special examinations: Secondary | ICD-10-CM | POA: Diagnosis not present

## 2023-11-07 DIAGNOSIS — M5106 Intervertebral disc disorders with myelopathy, lumbar region: Secondary | ICD-10-CM | POA: Insufficient documentation

## 2023-11-07 DIAGNOSIS — R9431 Abnormal electrocardiogram [ECG] [EKG]: Secondary | ICD-10-CM | POA: Diagnosis not present

## 2023-11-07 HISTORY — PX: LUMBAR LAMINECTOMY/DECOMPRESSION MICRODISCECTOMY: SHX5026

## 2023-11-07 LAB — CBC WITH DIFFERENTIAL/PLATELET
Abs Immature Granulocytes: 0.02 K/uL (ref 0.00–0.07)
Basophils Absolute: 0 K/uL (ref 0.0–0.1)
Basophils Relative: 0 %
Eosinophils Absolute: 0 K/uL (ref 0.0–0.5)
Eosinophils Relative: 0 %
HCT: 40.6 % (ref 39.0–52.0)
Hemoglobin: 14 g/dL (ref 13.0–17.0)
Immature Granulocytes: 1 %
Lymphocytes Relative: 13 %
Lymphs Abs: 0.5 K/uL — ABNORMAL LOW (ref 0.7–4.0)
MCH: 28.7 pg (ref 26.0–34.0)
MCHC: 34.5 g/dL (ref 30.0–36.0)
MCV: 83.2 fL (ref 80.0–100.0)
Monocytes Absolute: 0.1 K/uL (ref 0.1–1.0)
Monocytes Relative: 2 %
Neutro Abs: 3.2 K/uL (ref 1.7–7.7)
Neutrophils Relative %: 84 %
Platelets: 210 K/uL (ref 150–400)
RBC: 4.88 MIL/uL (ref 4.22–5.81)
RDW: 12.1 % (ref 11.5–15.5)
WBC: 3.8 K/uL — ABNORMAL LOW (ref 4.0–10.5)
nRBC: 0 % (ref 0.0–0.2)

## 2023-11-07 LAB — PROTIME-INR
INR: 1.1 (ref 0.8–1.2)
Prothrombin Time: 14.9 s (ref 11.4–15.2)

## 2023-11-07 LAB — BASIC METABOLIC PANEL WITH GFR
Anion gap: 9 (ref 5–15)
BUN: 15 mg/dL (ref 6–20)
CO2: 26 mmol/L (ref 22–32)
Calcium: 9.2 mg/dL (ref 8.9–10.3)
Chloride: 103 mmol/L (ref 98–111)
Creatinine, Ser: 1.05 mg/dL (ref 0.61–1.24)
GFR, Estimated: >60 mL/min (ref >60)
Glucose, Bld: 142 mg/dL — ABNORMAL HIGH (ref 70–99)
Potassium: 3.6 mmol/L (ref 3.5–5.1)
Sodium: 138 mmol/L (ref 135–145)

## 2023-11-07 LAB — TYPE AND SCREEN
ABO/RH(D): A POS
Antibody Screen: NEGATIVE

## 2023-11-07 SURGERY — LUMBAR LAMINECTOMY/DECOMPRESSION MICRODISCECTOMY 1 LEVEL
Anesthesia: General | Site: Spine Lumbar | Laterality: Left

## 2023-11-07 MED ORDER — CEFAZOLIN SODIUM-DEXTROSE 2-4 GM/100ML-% IV SOLN
2.0000 g | Freq: Three times a day (TID) | INTRAVENOUS | Status: DC
  Administered 2023-11-07: 2 g via INTRAVENOUS

## 2023-11-07 MED ORDER — ESMOLOL HCL 100 MG/10ML IV SOLN
INTRAVENOUS | Status: DC | PRN
Start: 2023-11-07 — End: 2023-11-07
  Administered 2023-11-07 (×3): 20 mg via INTRAVENOUS

## 2023-11-07 MED ORDER — BUPIVACAINE HCL (PF) 0.5 % IJ SOLN
INTRAMUSCULAR | Status: AC
  Filled 2023-11-07: qty 30

## 2023-11-07 MED ORDER — OXYCODONE HCL 5 MG PO TABS
5.0000 mg | ORAL_TABLET | ORAL | 0 refills | Status: AC | PRN
  Filled 2023-11-07: qty 30, 5d supply, fill #0

## 2023-11-07 MED ORDER — PROPOFOL 10 MG/ML IV BOLUS
INTRAVENOUS | Status: DC | PRN
Start: 2023-11-07 — End: 2023-11-07
  Administered 2023-11-07: 50 mg via INTRAVENOUS
  Administered 2023-11-07: 150 mg via INTRAVENOUS

## 2023-11-07 MED ORDER — CEFAZOLIN SODIUM-DEXTROSE 2-4 GM/100ML-% IV SOLN
INTRAVENOUS | Status: AC
  Filled 2023-11-07: qty 100

## 2023-11-07 MED ORDER — ORAL CARE MOUTH RINSE: 15.0000 mL | Freq: Once | OROMUCOSAL | Status: AC

## 2023-11-07 MED ORDER — ROCURONIUM BROMIDE 10 MG/ML (PF) SYRINGE
PREFILLED_SYRINGE | INTRAVENOUS | Status: AC
  Filled 2023-11-07: qty 10

## 2023-11-07 MED ORDER — LACTATED RINGERS IV SOLN: INTRAVENOUS | Status: DC

## 2023-11-07 MED ORDER — DEXAMETHASONE SOD PHOSPHATE PF 10 MG/ML IJ SOLN
INTRAMUSCULAR | Status: DC | PRN
  Administered 2023-11-07: 10 mg via INTRAVENOUS

## 2023-11-07 MED ORDER — SENNA 8.6 MG PO TABS
1.0000 | ORAL_TABLET | Freq: Two times a day (BID) | ORAL | 0 refills | Status: DC | PRN
  Filled 2023-11-07: qty 30, 15d supply, fill #0

## 2023-11-07 MED ORDER — ACETAMINOPHEN 10 MG/ML IV SOLN: 1000.0000 mg | Freq: Once | INTRAVENOUS | Status: DC | PRN

## 2023-11-07 MED ORDER — PROPOFOL 500 MG/50ML IV EMUL
INTRAVENOUS | Status: DC | PRN
  Administered 2023-11-07: 150 ug/kg/min via INTRAVENOUS

## 2023-11-07 MED ORDER — SURGIFLO WITH THROMBIN (HEMOSTATIC MATRIX KIT) OPTIME
TOPICAL | Status: DC | PRN
  Administered 2023-11-07: 1 via TOPICAL

## 2023-11-07 MED ORDER — METHYLPREDNISOLONE ACETATE 40 MG/ML IJ SUSP
INTRAMUSCULAR | Status: DC | PRN
  Administered 2023-11-07: 40 mg

## 2023-11-07 MED ORDER — CHLORHEXIDINE GLUCONATE 0.12 % MT SOLN
15.0000 mL | Freq: Once | OROMUCOSAL | Status: AC
  Administered 2023-11-07: 15 mL via OROMUCOSAL

## 2023-11-07 MED ORDER — BUPIVACAINE-EPINEPHRINE (PF) 0.5% -1:200000 IJ SOLN
INTRAMUSCULAR | Status: DC | PRN
  Administered 2023-11-07: 8 mL via PERINEURAL

## 2023-11-07 MED ORDER — MIDAZOLAM HCL 2 MG/2ML IJ SOLN
INTRAMUSCULAR | Status: AC
  Filled 2023-11-07: qty 2

## 2023-11-07 MED ORDER — FENTANYL CITRATE (PF) 100 MCG/2ML IJ SOLN: 25.0000 ug | INTRAMUSCULAR | Status: DC | PRN

## 2023-11-07 MED ORDER — FENTANYL CITRATE (PF) 100 MCG/2ML IJ SOLN
INTRAMUSCULAR | Status: AC
  Filled 2023-11-07: qty 2

## 2023-11-07 MED ORDER — DEXMEDETOMIDINE HCL IN NACL 80 MCG/20ML IV SOLN
INTRAVENOUS | Status: DC | PRN
Start: 2023-11-07 — End: 2023-11-07
  Administered 2023-11-07 (×2): 8 ug via INTRAVENOUS

## 2023-11-07 MED ORDER — SODIUM CHLORIDE (PF) 0.9 % IJ SOLN
INTRAMUSCULAR | Status: AC
  Filled 2023-11-07: qty 10

## 2023-11-07 MED ORDER — ONDANSETRON HCL 4 MG/2ML IJ SOLN
INTRAMUSCULAR | Status: DC | PRN
  Administered 2023-11-07: 4 mg via INTRAVENOUS

## 2023-11-07 MED ORDER — BUPIVACAINE LIPOSOME 1.3 % IJ SUSP
INTRAMUSCULAR | Status: AC
  Filled 2023-11-07: qty 10

## 2023-11-07 MED ORDER — KETAMINE HCL 50 MG/5ML IJ SOSY
PREFILLED_SYRINGE | INTRAMUSCULAR | Status: AC
Start: 2023-11-07 — End: 2023-11-07
  Filled 2023-11-07: qty 5

## 2023-11-07 MED ORDER — DROPERIDOL 2.5 MG/ML IJ SOLN: 0.6250 mg | Freq: Once | INTRAMUSCULAR | Status: DC | PRN

## 2023-11-07 MED ORDER — LACTATED RINGERS IV SOLN: INTRAVENOUS | Status: DC | PRN

## 2023-11-07 MED ORDER — LIDOCAINE HCL (CARDIAC) PF 100 MG/5ML IV SOSY
PREFILLED_SYRINGE | INTRAVENOUS | Status: DC | PRN
Start: 2023-11-07 — End: 2023-11-07
  Administered 2023-11-07: 100 mg via INTRAVENOUS

## 2023-11-07 MED ORDER — POLYETHYLENE GLYCOL 3350 17 GM/SCOOP PO POWD
17.0000 g | Freq: Every day | ORAL | 0 refills | Status: DC | PRN
  Filled 2023-11-07: qty 238, 14d supply, fill #0

## 2023-11-07 MED ORDER — BUPIVACAINE-EPINEPHRINE (PF) 0.5% -1:200000 IJ SOLN
INTRAMUSCULAR | Status: AC
  Filled 2023-11-07: qty 10

## 2023-11-07 MED ORDER — MIDAZOLAM HCL (PF) 2 MG/2ML IJ SOLN
INTRAMUSCULAR | Status: DC | PRN
  Administered 2023-11-07: 2 mg via INTRAVENOUS

## 2023-11-07 MED ORDER — CHLORHEXIDINE GLUCONATE 0.12 % MT SOLN
OROMUCOSAL | Status: AC
  Filled 2023-11-07: qty 15

## 2023-11-07 MED ORDER — OXYCODONE HCL 5 MG/5ML PO SOLN: 5.0000 mg | Freq: Once | ORAL | Status: DC | PRN

## 2023-11-07 MED ORDER — ROCURONIUM BROMIDE 100 MG/10ML IV SOLN
INTRAVENOUS | Status: DC | PRN
Start: 2023-11-07 — End: 2023-11-07
  Administered 2023-11-07: 60 mg via INTRAVENOUS
  Administered 2023-11-07: 20 mg via INTRAVENOUS

## 2023-11-07 MED ORDER — ACETAMINOPHEN 10 MG/ML IV SOLN
INTRAVENOUS | Status: AC
  Filled 2023-11-07: qty 100

## 2023-11-07 MED ORDER — LIDOCAINE HCL (PF) 2 % IJ SOLN
INTRAMUSCULAR | Status: AC
  Filled 2023-11-07: qty 5

## 2023-11-07 MED ORDER — ACETAMINOPHEN 10 MG/ML IV SOLN
INTRAVENOUS | Status: DC | PRN
  Administered 2023-11-07: 1000 mg via INTRAVENOUS

## 2023-11-07 MED ORDER — SUGAMMADEX SODIUM 200 MG/2ML IV SOLN
INTRAVENOUS | Status: DC | PRN
  Administered 2023-11-07: 200 mg via INTRAVENOUS

## 2023-11-07 MED ORDER — PROPOFOL 1000 MG/100ML IV EMUL
INTRAVENOUS | Status: AC
  Filled 2023-11-07: qty 100

## 2023-11-07 MED ORDER — FENTANYL CITRATE (PF) 100 MCG/2ML IJ SOLN
INTRAMUSCULAR | Status: DC | PRN
  Administered 2023-11-07: 100 ug via INTRAVENOUS

## 2023-11-07 MED ORDER — SODIUM CHLORIDE (PF) 0.9 % IJ SOLN
INTRAMUSCULAR | Status: DC | PRN
  Administered 2023-11-07: 30 mL via INTRAMUSCULAR

## 2023-11-07 MED ORDER — PROPOFOL 10 MG/ML IV BOLUS
INTRAVENOUS | Status: AC
  Filled 2023-11-07: qty 20

## 2023-11-07 MED ORDER — OXYCODONE HCL 5 MG PO TABS: 5.0000 mg | ORAL_TABLET | Freq: Once | ORAL | Status: DC | PRN

## 2023-11-07 MED ORDER — KETAMINE HCL 50 MG/5ML IJ SOSY
PREFILLED_SYRINGE | INTRAMUSCULAR | Status: DC | PRN
  Administered 2023-11-07: 50 mg via INTRAVENOUS

## 2023-11-07 MED ORDER — 0.9 % SODIUM CHLORIDE (POUR BTL) OPTIME
TOPICAL | Status: DC | PRN
  Administered 2023-11-07: 500 mL

## 2023-11-07 SURGICAL SUPPLY — 30 items
BASIN KIT SINGLE STR (MISCELLANEOUS) ×1 IMPLANT
BRUSH SCRUB EZ 4% CHG (MISCELLANEOUS) ×1 IMPLANT
BUR NEURO DRILL SOFT 3.0X3.8M (BURR) ×1 IMPLANT
DERMABOND ADVANCED .7 DNX12 (GAUZE/BANDAGES/DRESSINGS) ×1 IMPLANT
DRAPE C-ARM XRAY 36X54 (DRAPES) ×2 IMPLANT
DRAPE LAPAROTOMY 100X77 ABD (DRAPES) ×1 IMPLANT
DRAPE SPINE LEICA/WILD 54X150 (DRAPES) ×1 IMPLANT
DRSG OPSITE POSTOP 3X4 (GAUZE/BANDAGES/DRESSINGS) ×1 IMPLANT
DRSG TEGADERM 4X4.75 (GAUZE/BANDAGES/DRESSINGS) IMPLANT
ELECTRODE EZSTD 165MM 6.5IN (MISCELLANEOUS) ×1 IMPLANT
ELECTRODE REM PT RTRN 9FT ADLT (ELECTROSURGICAL) ×1 IMPLANT
GLOVE BIOGEL PI IND STRL 7.0 (GLOVE) ×1 IMPLANT
GLOVE BIOGEL PI IND STRL 8 (GLOVE) ×2 IMPLANT
GLOVE SURG SYN 7.0 PF PI (GLOVE) ×1 IMPLANT
GLOVE SURG SYN 7.5 PF PI (GLOVE) ×1 IMPLANT
GOWN SRG XL LVL 3 NONREINFORCE (GOWNS) ×1 IMPLANT
GOWN STRL REUS W/ TWL LRG LVL3 (GOWN DISPOSABLE) ×1 IMPLANT
KIT WILSON FRAME (KITS) ×1 IMPLANT
KNIFE BAYONET SHORT DISCETOMY (MISCELLANEOUS) IMPLANT
NDL SAFETY ECLIP 18X1.5 (MISCELLANEOUS) ×1 IMPLANT
NS IRRIG 500ML POUR BTL (IV SOLUTION) ×1 IMPLANT
PACK LAMINECTOMY ARMC (PACKS) ×1 IMPLANT
PAD ARMBOARD POSITIONER FOAM (MISCELLANEOUS) ×2 IMPLANT
SURGIFLO W/THROMBIN 8M KIT (HEMOSTASIS) ×1 IMPLANT
SUT STRATA 3-0 15 PS-2 (SUTURE) ×1 IMPLANT
SUT VIC AB 0 CT1 27XCR 8 STRN (SUTURE) ×1 IMPLANT
SUT VIC AB 2-0 CT1 18 (SUTURE) ×1 IMPLANT
SYR 30ML LL (SYRINGE) ×2 IMPLANT
SYR 3ML LL SCALE MARK (SYRINGE) ×1 IMPLANT
TRAP FLUID SMOKE EVACUATOR (MISCELLANEOUS) ×1 IMPLANT

## 2023-11-07 NOTE — Telephone Encounter (Signed)
 I notified ED first nurse Heather of Dr [facility] plan.

## 2023-11-07 NOTE — Progress Notes (Signed)
 Discharge medications received from [facility] pharmacy. Sealed bag of medications placed in locked cabinet. Medications will be given to patient at time of discharge.   Hunter JONETTA Hope, RN

## 2023-11-07 NOTE — Telephone Encounter (Signed)
 I called and spoke with Shawn Franco.  Based on his MRI findings and the weakness that he experienced I will go ahead and refer immediately to neurosurgery for surgical consultation.  I do not think another injection makes a lot of sense without speaking to a surgeon first.

## 2023-11-07 NOTE — ED Triage Notes (Signed)
 Patient to ED via POV for left leg weakness. Seen yesterday for same. Sent for surgery today. Surgeon at bedside with patient during triage.

## 2023-11-07 NOTE — Telephone Encounter (Signed)
 I had a phone call with this patient today.  He had reached out to the neurosurgery office about a ER consultation for outpatient follow-up.  On review of his documentation he has acute onset of left lower extremity weakness is unable to walk or bear weight on that leg he has a history of chronic sciatica but has never had weakness starting late Sunday and into Monday he has been unable to lift his body weight with that leg or fully extend his knee.  He also has loss of sensation on the medial aspect of his distal lower extremity.  Given his inability to ambulate, lack of antigravity strength in his knee extension and large acute disc herniation noted on his lumbar MRI will plan to have him present to the emergency department for emergent evaluation.  His weakness started less than 48 hours ago, should he show severe weakness and his clinical examination we will plan to take to the OR for hemilaminectomy discectomy for his acute deficit.  Spent a total of 10 minutes on the phone call with him today.  Penne MICAEL Sharps, MD

## 2023-11-07 NOTE — Op Note (Signed)
 Indications: [name] is suffering from a severe lumbar radiculopathy. He presented with a severe progressive deficit in his left lower extremity, secondary to a l4-5 disk herniation. He was taken to OR urgently for a left-sided L4-5 microdiscectomy.  Findings: Large extruded cranially migrated disc fragment causing severe compression and deformation of the traversing L4 nerve root, well decompressed at the end of the procedure.    Preoperative Diagnosis:Severe Lumbar Radiculopathy  Postoperative Diagnosis: Severe Lumbar Radiculopathy   EBL: Minimal IVF: see anesthesia record Drains: none Disposition: Extubated and Stable to PACU Complications: none  No foley catheter was placed.   Preoperative Note:   Risks of surgery discussed include: infection, bleeding, stroke, coma, death, paralysis, CSF leak, nerve/spinal cord injury, numbness, tingling, weakness, complex regional pain syndrome, recurrent stenosis and/or disc herniation, vascular injury, development of instability, neck/back pain, need for further surgery, persistent symptoms, development of deformity, and the risks of anesthesia. The patient understood these risks and agreed to proceed.  Operative Note:   1) Left L4/5 microdiscectomy  The patient was then brought from the preoperative center with intravenous access established.  The patient underwent general anesthesia and endotracheal tube intubation, and was then rotated on the [place] rail top where all pressure points were appropriately padded.  The skin was then thoroughly cleansed.  Perioperative antibiotic prophylaxis was administered.  Sterile prep and drapes were then applied and a timeout was then observed.  C-arm was brought into the field under sterile conditions, and the L4-5 disc space identified and marked with an incision on the Left 1cm lateral to midline.  Once this was complete a 2 cm incision was opened with the use of a #10 blade knife.  The Metrx  tubes were sequentially advanced under lateral fluoroscopy until a 18 x 50 mm Metrx tube was placed over the facet and lamina and secured to the bed.    The microscope was then sterilely brought into the field and muscle creep was hemostased with a bipolar and resected with a pituitary rongeur.  A Bovie extender was then used to expose the spinous process and lamina.  Careful attention was placed to not violate the facet capsule. A 3 mm matchstick drill bit was then used to make a hemi-laminotomy trough until the ligamentum flavum was exposed.  This was extended to the base of the spinous process.  Once this was complete and the underlying ligamentum flavum was visualized, the ligamentum was dissected with an up angle curette and resected with a #2 and #3 mm biting Kerrison.  The laminotomy opening was also expanded in similar fashion and hemostasis was obtained with Surgifoam and a patty as well as bone wax.  The rostral aspect of the caudal level of the lamina was also resected with a #2 biting Kerrison effort to further enhance exposure.  Once the underlying dura was visualized a Penfield 4 was then used to dissect and expose the traversing nerve root. The nerve root was severely displaced and deformed by the extruded disk fragment.  Once this was identified a nerve root retractor suction was used to mobilize this medially.  The venous plexus was hemostased with Surgifoam and light bipolar use.  A small penfied was then used to make a small opening in the PLL and the largest part of the disk fragment came out under pressure.   The disc herniation was identified and dissected free using a balltip probe. The pituitary rongeur was used to remove the extruded disc fragments. Once the thecal  sac and nerve root were noted to be relaxed and under less tension the ball-tipped feeler was passed along the foramen distally to ensure no residual compression was noted.    Depo-Medrol was placed along the nerve root.   The area was irrigated. The tube system was then removed under microscopic visualization and hemostasis was obtained with a bipolar.    The fascial layer was reapproximated with the use of a 0- Vicryl suture.  Subcutaneous tissue layer was reapproximated using 2-0 Vicryl suture.  3-0 monocryl was used on the skin. The skin was then cleansed and Dermabond was used to close the skin opening.  Patient was then rotated back to the preoperative bed awakened from anesthesia and taken to recovery all counts are correct in this case.  I performed this procedure without an Designer, television/film set.   Penne LELON Sharps, MD [facility] Neurosurgery

## 2023-11-07 NOTE — Consult Note (Signed)
 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  He has a history of chronic sciatic nerve pain but states that over the past 3 to 4 days has had worsening of his left lower extremity pain.  He was given Medrol Dosepak for pain control which helped however yesterday he started noticed that he was having severe weakness in his left lower extremity.  He was unable to bear weight on this leg as it would buckle.  He was losing sensation on the medial aspect of his lower extremity.  He states that his weakness continued to worsen.  He is unable to fully extend his knee and if he steps on a bent leg he cannot support his body weight.  He is not having any bowel or bladder dysfunction.  The symptoms are causing a significant impact on the patient's life.   Review of Systems:  A 10 point review of systems is negative, except for the pertinent positives and negatives detailed in the HPI.  Past Medical History: Past Medical History:  Diagnosis Date   Duodenal nodule 04/2019   Dr. Wilhelmenia   Gastroesophageal reflux disease without esophagitis 02/13/2017   H. pylori infection 04/2019   History of colon polyps 2021   Seasonal allergic rhinitis due to pollen 02/13/2017    Past Surgical History: Past Surgical History:  Procedure Laterality Date   BIOPSY  03/04/2021   Procedure: BIOPSY;  Surgeon: Wilhelmenia Aloha Raddle., MD;  Location: [facility] ENDOSCOPY;  Service: Gastroenterology;;   MARVIE CANALOPLASTY     ESOPHAGOGASTRODUODENOSCOPY (EGD) WITH PROPOFOL  N/A 03/04/2021   Procedure: ESOPHAGOGASTRODUODENOSCOPY (EGD) WITH PROPOFOL ;  Surgeon: Wilhelmenia Aloha Raddle., MD;  Location: [facility] ENDOSCOPY;  Service: Gastroenterology;  Laterality: N/A;   EUS N/A 03/04/2021    Procedure: UPPER ENDOSCOPIC ULTRASOUND (EUS) RADIAL;  Surgeon: Wilhelmenia Aloha Raddle., MD;  Location: [facility] ENDOSCOPY;  Service: Gastroenterology;  Laterality: N/A;   NOSE SURGERY      Allergies: Allergies as of 11/07/2023 - Review Complete 11/07/2023  Allergen Reaction Noted   Procaine Anaphylaxis 03/17/2015   Tetanus toxoid-containing vaccines Anaphylaxis 03/17/2015    Medications: No current facility-administered medications for this encounter.  Current Outpatient Medications:    acetaminophen (TYLENOL) 500 MG tablet, Take 500-1,000 mg by mouth every 6 (six) hours as needed (for pain.)., Disp: , Rfl:    cetirizine (ZYRTEC) 10 MG tablet, Take 10 mg by mouth daily as needed., Disp: , Rfl:    fluticasone  (FLONASE ) 50 MCG/ACT nasal spray, Place 2 sprays into both nostrils as needed. (Patient taking differently: Place 2 sprays into both nostrils daily as needed for allergies.), Disp: 16 g, Rfl: 4   gabapentin  (NEURONTIN ) 100 MG capsule, Take 1 capsule (100 mg total) by mouth 3 (three) times daily., Disp: 90 capsule, Rfl: 3   methocarbamol  (ROBAXIN ) 750 MG tablet, Take 1 tablet (750 mg total) by mouth every 8 (eight) hours as needed for muscle spasms., Disp: 90 tablet, Rfl: 3   omeprazole  (PRILOSEC) 20 MG capsule, Take 1 capsule (20 mg total) by mouth daily., Disp: 90 capsule, Rfl: 3   predniSONE  (DELTASONE ) 20 MG tablet, Take 1 tablet (20 mg total) by mouth daily as needed for back pain, Disp: 30 tablet, Rfl: 0   valACYclovir  (VALTREX ) 1000 MG tablet, Take 2 tablets (2,000 mg total) by mouth 2 (  two) times daily  for 1 day and may repeat with future outbreaks, Disp: 30 tablet, Rfl: 2   VITAMIN D PO, Take 1 tablet by mouth daily at 6 (six) AM. Takes occasionally, Disp: , Rfl:    Social History: Social History   Tobacco Use   Smoking status: Never   Smokeless tobacco: Never  Vaping Use   Vaping status: Never Used  Substance Use Topics   Alcohol use: Not Currently   Drug use: Never     Family Medical History: Family History  Problem Relation Age of Onset   Diabetes Mother    Hypertension Mother    Colon cancer Neg Hx    Esophageal cancer Neg Hx    Stomach cancer Neg Hx    Prostate cancer Neg Hx    Colon polyps Neg Hx    Rectal cancer Neg Hx     Physical Examination: Vitals:   11/07/23 1512 11/07/23 1515  BP: (!) 146/104 (!) 130/97  Pulse: (!) 105   Resp: 18   Temp: 98 F (36.7 C)   SpO2: 94%      General: Patient is well developed, well nourished, calm, collected, and in no apparent distress.  NEUROLOGICAL:  General: Significant weakness noted. Awake, alert, oriented to person, place, and time.  Pupils equal round and reactive to light.  Facial tone is symmetric.  Tongue protrusion is midline.  There is no pronator drift.  Strength:  Side Iliopsoas Quads Hamstring PF DF EHL  R 5 5 5 5 5 5   L 5 3 5  4+ 3 4+    Bilateral upper and lower extremity sensation is intact to light touch, with the exception of a loss of sensation in the left-sided L4 dermatome  Reflexes are 1-2+ throughout with the exception of a complete loss of left-sided patellar reflex.    Gait is abnormal, patient is unable to step on a bent leg as it will buckle because he cannot hold up his body weight.  He walks with his leg fixed in a straight position   Imaging: MR LUMBAR SPINE WO CONTRAST Result Date: 11/06/2023 EXAM: MRI LUMBAR SPINE 11/06/2023 09:19:15 PM TECHNIQUE: Multiplanar multisequence MRI of the lumbar spine was performed without the administration of intravenous contrast. COMPARISON: 03/20/2023 CLINICAL HISTORY: Lumbar radiculopathy, symptoms persist with > 6 wks treatment. Per RN: Pt comes in via pov with complaints of left leg weakness that started to day. Pt has a history of chronic sciatic back pain, and was up most of the night with pain in his lower back. Pt states that the leg is buckling now due to the sciatic nerve pain. Pt complains of lower back pain 2/10  at this time. FINDINGS: BONES AND ALIGNMENT: Normal alignment. Normal vertebral body heights. Bone marrow signal is unremarkable. SPINAL CORD: The conus terminates normally. SOFT TISSUES: No paraspinal mass. L1-L2: No significant disc herniation. No spinal canal stenosis or neural foraminal narrowing. L2-L3: No significant disc herniation. No spinal canal stenosis or neural foraminal narrowing. L3-L4: No significant disc herniation. No spinal canal stenosis or neural foraminal narrowing. L4-L5: Intermediate-sized disc bulge with new superimposed left subarticular protrusion extrusion with superior migration to the L4 pedicle level. This severely narrows the left lateral recess and displaces the associated nerve roots. This could contribute to left L4 or L5 radiculopathy. There is no neural foraminal stenosis. Mild central spinal canal stenosis. L5-S1: No significant disc herniation. No spinal canal stenosis or neural foraminal narrowing. IMPRESSION: 1. Left subarticular disc extrusion at  L4-5 with superior migration to the L4 pedicle level, causing severe left lateral recess narrowing and displacement of the traversing nerve roots, compatible with left L4 or L5 radiculopathy. 2. No neural foraminal stenosis. Electronically signed by: Franky Stanford MD 11/06/2023 09:56 PM EDT RP Workstation: [id number]     I have personally reviewed the images and agree with the above interpretation.  Labs:    Latest Ref Rng & Units 05/10/2023   12:00 AM 03/29/2022   12:00 AM 03/28/2022    8:28 AM  CBC  WBC  3.1     4.3       Hemoglobin 13.5 - 17.5 12.8     13.8     13.9   Hematocrit 41 - 53 42     42     41.0   Platelets 150 - 400 K/uL 202     210          This result is from an external source.      Latest Ref Rng & Units 05/10/2023   12:00 AM 03/29/2022   12:00 AM 03/28/2022    8:28 AM  BMP  Glucose 70 - 99 mg/dL   883   BUN 4 - 21  15     16    Creatinine 0.6 - 1.3 1.1     1.3     1.10   Sodium 137 - 147 143      143     141   Potassium 3.5 - 5.1 mEq/L 4.2     4.4     3.9   Chloride 99 - 108 105     107     104   CO2 13 - 22 23     20        Calcium 8.7 - 10.7 9.1     9.5          This result is from an external source.        Assessment and Plan: Mr. Tumbleson is a pleasant 53 y.o. male with presentation of his severe acute lumbar radiculopathy with progressive lower extremity deficit.  He was in his usual state of health until approximately 3 to 4 days ago started to feel worsening pain in his low back and left lower extremity, however on Sunday evening he started to notice some weakness in his leg, this weakness became progressively worse yesterday and he was having severe deficits in his left lower extremity.  He underwent an MRI which showed a large new disc herniation with severe compression of his L4 nerve root.  He called into the office for clinic visit today and was directed back to the emergency department given his history of a worsening deficit.  When I saw him in the emergency department he does not have antigravity knee extension nor does he have antigravity dorsi flexion, he has a loss of his patellar reflex and a loss of sensation in the left sided L4 dermatome.  MRI was reviewed which showed a large extruded disc at L4-5, in general this would cause a L5 type radiculopathy, however in his case it has a cranial extrusion causing severe compression of the L4 nerve root.  Because of his progressive deficit inability to walk uncontrollable pain and inability to perform his activities of daily living recommendation was made for a left-sided minimally invasive discectomy.  We discussed risk benefits of surgery including but not limited to nerve injury, CSF leak, spine injury, need for further surgery, instability leading to  fusion.  Given his progressive weakness and inability to walk on that leg he would like to go forward with the procedure.  Penne MICAEL Sharps, MD/MSCR Dept. of Neurosurgery

## 2023-11-07 NOTE — ED Provider Notes (Signed)
 [facility] Provider Note    Event Date/Time   First MD Initiated Contact with Patient 11/07/23 1517     (approximate)   History   Extremity Weakness  Patient to ED via POV for left leg weakness. Seen yesterday for same. Sent for surgery today. Surgeon at bedside with patient during triage.   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  No neural foraminal stenosis. -Patient spoke with his outpatient sports medicine provider today who discussed with neurosurgery, neurosurgery reviewed imaging and recommended patient come back to the hospital for discectomy -On my eval, patient does note he has had about 2 days of left leg weakness.  No urinary or fecal incontinence.  Does have some numbness to his left great toe.  No preceding trauma.     Physical Exam   Triage Vital Signs: ED Triage Vitals  Encounter Vitals Group     BP 11/07/23 1512 (!) 146/104     Girls Systolic BP Percentile --      Girls Diastolic BP Percentile --      Boys Systolic BP Percentile --      Boys Diastolic BP Percentile --      Pulse Rate 11/07/23 1512 (!) 105     Resp 11/07/23 1512 18     Temp 11/07/23 1512 98 F (36.7 C)     Temp Source 11/07/23 1512 Oral     SpO2 11/07/23 1512 94 %     Weight 11/07/23 1513 200 lb (90.7 kg)     Height 11/07/23 1513 5' 11 (1.803 m)     Head Circumference --      Peak Flow --      Pain Score 11/07/23 1513 3     Pain Loc --      Pain Education --      Exclude from Growth Chart --     Most recent vital  signs: Vitals:   11/07/23 1512 11/07/23 1515  BP: (!) 146/104 (!) 130/97  Pulse: (!) 105   Resp: 18   Temp: 98 F (36.7 C)   SpO2: 94%      General: Awake, no distress.  CV:  Good peripheral perfusion.  Mild tachycardia, regular rhythm, pedal pulse 2+  Resp:  Normal effort.  LLE:   No deformity, pedal pulse 2+, + straight leg raise, no swelling, able to lift leg antigravity but apparent decreased strength compared to right   ED Results / Procedures / Treatments   Labs (all labs ordered are listed, but only abnormal results are displayed) Labs Reviewed  CBC WITH DIFFERENTIAL/PLATELET - Abnormal; Notable for the following components:      Result Value   WBC 3.8 (*)    Lymphs Abs 0.5 (*)    All other components within normal limits  BASIC METABOLIC PANEL WITH GFR - Abnormal; Notable for the following components:   Glucose, Bld 142 (*)    All other components within normal limits  PROTIME-INR  TYPE AND SCREEN     EKG  Ecg = sinus rhythm, rate 62, no gross ST elevation or depression, no  significant repolarization abnormality, normal axis, normal intervals.  No evidence of ischemia nor arrhythmia interpretation.   RADIOLOGY Chest x-ray interpreted by myself and radiology report reviewed.  No acute pathology identified.    PROCEDURES:  Critical Care performed: No  Procedures   MEDICATIONS ORDERED IN ED: Medications - No data to display   IMPRESSION / MDM / ASSESSMENT AND PLAN / ED COURSE  I reviewed the triage vital signs and the nursing notes.                              DDX/MDM/AP: Differential diagnosis includes, but is not limited to, severe sciatica, do not suspect interval development of acute spinal cord pathology, do not suspect other etiology of pain.  Plan: -Neurosurgery planning for OR - Basic preoperative labs - Preoperative chest x-ray - Preoperative EKG - Patient denies need for pain medications at this time  Patient's presentation is  most consistent with acute presentation with potential threat to life or bodily function.  The patient is on the cardiac monitor to evaluate for evidence of arrhythmia and/or significant heart rate changes.  ED course below.  Preoperative screening unremarkable.  Transferred to OR with neurosurgery, tentative plan for discharge from PACU.  Clinical Course as of 11/07/23 1628  Tue Nov 07, 2023  1544 D/w Dr. Claudene of NSGY - Agrees with preoperative labs, chest x-ray, EKG -Tentative plan for discharge home from the OR [MM]  1619 CBC with mild leukopenia, otherwise unremarkable [MM]  1627 BMP reviewed, unremarkable  Coags normal [MM]    Clinical Course User Index [MM] Clarine Ozell LABOR, MD     FINAL CLINICAL IMPRESSION(S) / ED DIAGNOSES   Final diagnoses:  Lumbar radiculopathy     Rx / DC Orders   ED Discharge Orders     None        Note:  This document was prepared using Dragon voice recognition software and may include unintentional dictation errors.   Clarine Ozell LABOR, MD 11/07/23 [phone]

## 2023-11-07 NOTE — Anesthesia Preprocedure Evaluation (Signed)
 Anesthesia Evaluation  Patient identified by MRN, date of birth, ID band Patient awake    Reviewed: Allergy & Precautions, H&P , NPO status , Patient's Chart, lab work & pertinent test results, reviewed documented beta blocker date and time   Airway Mallampati: II  TM Distance: >3 FB Neck ROM: full    Dental  (+) Teeth Intact   Pulmonary neg pulmonary ROS   Pulmonary exam normal        Cardiovascular negative cardio ROS Normal cardiovascular exam Rhythm:regular Rate:Normal     Neuro/Psych  Neuromuscular disease  negative psych ROS   GI/Hepatic Neg liver ROS,GERD  Medicated,,  Endo/Other  negative endocrine ROS    Renal/GU negative Renal ROS  negative genitourinary   Musculoskeletal   Abdominal   Peds  Hematology negative hematology ROS (+)   Anesthesia Other Findings Past Medical History: 04/2019: Duodenal nodule     Comment:  Dr. Wilhelmenia 02/13/2017: Gastroesophageal reflux disease without esophagitis 04/2019: H. pylori infection 2021: History of colon polyps 02/13/2017: Seasonal allergic rhinitis due to pollen Past Surgical History: 03/04/2021: BIOPSY     Comment:  Procedure: BIOPSY;  Surgeon: Wilhelmenia Aloha Raddle.,               MD;  Location: [facility] ENDOSCOPY;  Service: Gastroenterology;; No date: EAR CANALOPLASTY 03/04/2021: ESOPHAGOGASTRODUODENOSCOPY (EGD) WITH PROPOFOL ; N/A     Comment:  Procedure: ESOPHAGOGASTRODUODENOSCOPY (EGD) WITH               PROPOFOL ;  Surgeon: Wilhelmenia Aloha Raddle., MD;                Location: [facility] ENDOSCOPY;  Service: Gastroenterology;                Laterality: N/A; 03/04/2021: EUS; N/A     Comment:  Procedure: UPPER ENDOSCOPIC ULTRASOUND (EUS) RADIAL;                Surgeon: Wilhelmenia Aloha Raddle., MD;  Location: [facility]               ENDOSCOPY;  Service: Gastroenterology;  Laterality: N/A; No date: NOSE SURGERY BMI    Body Mass Index: 27.89 kg/m      Reproductive/Obstetrics negative OB ROS                              Anesthesia Physical Anesthesia Plan  ASA: 2 and emergent  Anesthesia Plan: General ETT   Post-op Pain Management:    Induction:   PONV Risk Score and Plan:   Airway Management Planned:   Additional Equipment:   Intra-op Plan:   Post-operative Plan:   Informed Consent: I have reviewed the patients History and Physical, chart, labs and discussed the procedure including the risks, benefits and alternatives for the proposed anesthesia with the patient or authorized representative who has indicated his/her understanding and acceptance.     Dental Advisory Given  Plan Discussed with: CRNA  Anesthesia Plan Comments:         Anesthesia Quick Evaluation

## 2023-11-07 NOTE — Anesthesia Procedure Notes (Signed)
 Procedure Name: Intubation Date/Time: 11/07/2023 6:00 PM  Performed by: Brien Sotero PARAS, CRNAPre-anesthesia Checklist: Patient identified, Patient being monitored, Timeout performed, Emergency Drugs available and Suction available Patient Re-evaluated:Patient Re-evaluated prior to induction Oxygen Delivery Method: Circle system utilized Preoxygenation: Pre-oxygenation with 100% oxygen Induction Type: IV induction Ventilation: Mask ventilation without difficulty Laryngoscope Size: Mac and 3 Grade View: Grade I Tube type: Oral Tube size: 7.0 mm Number of attempts: 1 Airway Equipment and Method: Stylet Placement Confirmation: ETT inserted through vocal cords under direct vision, positive ETCO2 and breath sounds checked- equal and bilateral Secured at: 23 cm Tube secured with: Tape Dental Injury: Teeth and Oropharynx as per pre-operative assessment

## 2023-11-07 NOTE — Progress Notes (Signed)
 Patient able to ambulate in pacu, to bathroom. Voided. Gait steady. Denies pain.  Tolerated po fluids, crackers, cookies without event. Patient states he is a light weight and needed to sleep Wife working until 2200 and will pick him up at 2245

## 2023-11-07 NOTE — Transfer of Care (Signed)
 Immediate Anesthesia Transfer of Care Note  Patient: Shawn Carwin, MD  Procedure(s) Performed: Left Side L4-5 Microdiscectomy, MIS (Left: Spine Lumbar)  Patient Location: PACU  Anesthesia Type:General  Level of Consciousness: drowsy  Airway & Oxygen Therapy: Patient Spontanous Breathing and Patient connected to face mask oxygen  Post-op Assessment: Report given to RN and Post -op Vital signs reviewed and stable  Post vital signs: Reviewed and stable  Last Vitals:  Vitals Value Taken Time  BP 138/87 11/07/23 21:00  Temp 36.4 C 11/07/23 19:34  Pulse 56 11/07/23 21:03  Resp 20 11/07/23 21:03  SpO2 100 % 11/07/23 21:03  Vitals shown include unfiled device data.  Last Pain:  Vitals:   11/07/23 2030  TempSrc:   PainSc: 0-No pain         Complications: No notable events documented.

## 2023-11-07 NOTE — Discharge Instructions (Addendum)

## 2023-11-08 ENCOUNTER — Encounter: Payer: Self-pay | Admitting: Neurosurgery

## 2023-11-13 ENCOUNTER — Ambulatory Visit (INDEPENDENT_AMBULATORY_CARE_PROVIDER_SITE_OTHER): Admitting: Audiology

## 2023-11-15 ENCOUNTER — Encounter (INDEPENDENT_AMBULATORY_CARE_PROVIDER_SITE_OTHER): Payer: Self-pay | Admitting: Otolaryngology

## 2023-11-15 NOTE — Anesthesia Postprocedure Evaluation (Signed)
 Anesthesia Post Note  Patient: Shawn Carwin, MD  Procedure(s) Performed: Left Side L4-5 Microdiscectomy, MIS (Left: Spine Lumbar)  Patient location during evaluation: PACU Anesthesia Type: General Level of consciousness: awake and alert Pain management: pain level controlled Vital Signs Assessment: post-procedure vital signs reviewed and stable Respiratory status: spontaneous breathing, nonlabored ventilation, respiratory function stable and patient connected to nasal cannula oxygen Cardiovascular status: blood pressure returned to baseline and stable Postop Assessment: no apparent nausea or vomiting Anesthetic complications: no   No notable events documented.   Last Vitals:  Vitals:   11/07/23 2200 11/07/23 2228  BP: (!) 135/91 (!) 132/91  Pulse: (!) 51 65  Resp: 12 16  Temp: 36.4 C (!) 36.2 C  SpO2: 97% 95%    Last Pain:  Vitals:   11/07/23 2228  TempSrc: Temporal  PainSc: 0-No pain                 Shawn Franco

## 2023-11-21 ENCOUNTER — Encounter: Payer: Self-pay | Admitting: Gastroenterology

## 2023-11-21 ENCOUNTER — Encounter: Payer: Self-pay | Admitting: Physician Assistant

## 2023-11-21 ENCOUNTER — Ambulatory Visit: Admitting: Physician Assistant

## 2023-11-21 VITALS — BP 110/76 | Temp 97.7°F

## 2023-11-21 DIAGNOSIS — M5106 Intervertebral disc disorders with myelopathy, lumbar region: Secondary | ICD-10-CM

## 2023-11-21 DIAGNOSIS — Z09 Encounter for follow-up examination after completed treatment for conditions other than malignant neoplasm: Secondary | ICD-10-CM

## 2023-11-21 DIAGNOSIS — M5416 Radiculopathy, lumbar region: Secondary | ICD-10-CM

## 2023-11-21 NOTE — Progress Notes (Signed)
   REFERRING PHYSICIAN:  Job Lukes, [place] [address] [place],  [place] [address]  DOS: 11/07/23, Left sided L4-5 microdiscectomy  HISTORY OF PRESENT ILLNESS: Shawn Franco is approximately 2 weeks status post Left sided L4-5 microdiscectomy. he is doing well.  He has had significant improvement in the pain On his left leg.  However he still has numbness in his foot and his big toe.  He also acknowledges that he still has a foot drop/weakness.  Denies any falls since surgery.  He is taking Robaxin  and Tylenol as needed for pain.    PHYSICAL EXAMINATION:  General: Patient is well developed, well nourished, calm, collected, and in no apparent distress.   NEUROLOGICAL:  General: In no acute distress.   Awake, alert, oriented to person, place, and time.  Pupils equal round and reactive to light.  Facial tone is symmetric.     Strength:             Side Iliopsoas Quads Hamstring PF DF EHL  R 5 5 5 5 5 5   L 5 4 5 5 3  4+   Incision c/d/i   ROS (Neurologic):  Negative except as noted above  IMAGING: No new interval imaging  ASSESSMENT/PLAN:  Shawn Franco is doing well approximately 2 weeks after left-sided L4-5 microdiscectomy. he will follow up in approximately 1 month for 6-week postop visit.  He needs to have some weakness in his left lower extremity as well as numbness, but this is improved compared to preoperatively.  I have advised the patient to lift up to 10 pounds until 6 weeks after surgery, then increase up to 25 pounds until 12 weeks after surgery.  After 12 weeks post-op, the patient advised to increase activity as tolerated.  Advised to contact the office if any questions or concerns arise.  Lyle Decamp PA-C Department of neurosurgery

## 2023-12-11 ENCOUNTER — Other Ambulatory Visit: Payer: Self-pay | Admitting: Neurosurgery

## 2023-12-11 ENCOUNTER — Encounter: Payer: Self-pay | Admitting: Neurosurgery

## 2023-12-11 ENCOUNTER — Other Ambulatory Visit

## 2023-12-11 ENCOUNTER — Ambulatory Visit (INDEPENDENT_AMBULATORY_CARE_PROVIDER_SITE_OTHER): Admitting: Neurosurgery

## 2023-12-11 VITALS — BP 116/84 | Temp 98.0°F | Ht 71.0 in | Wt 200.0 lb

## 2023-12-11 DIAGNOSIS — M5416 Radiculopathy, lumbar region: Secondary | ICD-10-CM

## 2023-12-11 DIAGNOSIS — M5106 Intervertebral disc disorders with myelopathy, lumbar region: Secondary | ICD-10-CM

## 2023-12-11 DIAGNOSIS — R29898 Other symptoms and signs involving the musculoskeletal system: Secondary | ICD-10-CM

## 2023-12-11 DIAGNOSIS — M4312 Spondylolisthesis, cervical region: Secondary | ICD-10-CM | POA: Diagnosis not present

## 2023-12-11 DIAGNOSIS — M5010 Cervical disc disorder with radiculopathy, unspecified cervical region: Secondary | ICD-10-CM

## 2023-12-11 DIAGNOSIS — M4302 Spondylolysis, cervical region: Secondary | ICD-10-CM

## 2023-12-11 DIAGNOSIS — M5412 Radiculopathy, cervical region: Secondary | ICD-10-CM | POA: Diagnosis not present

## 2023-12-11 DIAGNOSIS — M50123 Cervical disc disorder at C6-C7 level with radiculopathy: Secondary | ICD-10-CM | POA: Diagnosis not present

## 2023-12-11 DIAGNOSIS — Z09 Encounter for follow-up examination after completed treatment for conditions other than malignant neoplasm: Secondary | ICD-10-CM

## 2023-12-11 NOTE — Progress Notes (Signed)
   REFERRING PHYSICIAN:  Job Lukes, Pa [address] [place],  [place] [address]  DOS: 11/07/23, Left sided L4-5 microdiscectomy  Discussed the use of AI scribe software for clinical note transcription with the patient, who gave verbal consent to proceed.  History of Present Illness Shawn Franco is a 53 year old male who presents for follow-up after recent lumbar spine decompression with an L4-5 left-sided microdiscectomy.  He is currently 6 weeks postoperatively.  He has improved weekly since surgery, but numbness in his big toe persists. He also has ongoing weakness with a high-stepping gait to avoid foot drop, more noticeable when wearing shoes, with easier walking barefoot. Numbness and tingling increase when sitting, especially on the toilet, which he relates to pressure and position.  Overall however he has had a significant improvement in both motor function and pain.  His acute pain has markedly improved. He avoids bending, uses tools to pick items up, limits driving, and plans to return to work while avoiding lifting more than 25 pounds.  He notes tingling and numbness from his neck into his arm, which he attributes to cervical spine problems, with occasional discomfort between his shoulder blades.    PHYSICAL EXAMINATION:  General: Patient is well developed, well nourished, calm, collected, and in no apparent distress.   NEUROLOGICAL:  General: In no acute distress.   Awake, alert, oriented to person, place, and time.  Pupils equal round and reactive to light.  Facial tone is symmetric.     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 Persistent numbness in the big toe and weakness around the knee, particularly noticeable when wearing shoes. Sensory recovery is variable and may not fully return. Reflexes and muscle strength have improved significantly post-surgery. Numbness and tingling persist, especially when sitting, due to the severity of the initial condition. Recovery is expected to be slower due to the severity of the initial condition. - Continue physical therapy to address deficits. - Advised to avoid excessive flexion to prevent exacerbation of symptoms. - Allowed return to work with light duty restrictions, not lifting more than 25 pounds.  Cervical disc disorder with radiculopathy Symptoms include tingling and numbness from the neck to the arm, likely involving C5 and C6 levels. Previous MRI showed disc bulges at C4-5 and worse condition at C6-7 on the right side. Symptoms do not extend to the shoulder blades, suggesting involvement of C5-C6. Discussed potential for epidural steroid injections and the need for updated imaging to guide treatment.  - Ordered cervical flexion-extension x-rays to assess for instability. - Ordered repeat cervical MRI to evaluate current status of disc bulges. - Will consider epidural steroid injections based on updated imaging results.

## 2023-12-12 ENCOUNTER — Ambulatory Visit (INDEPENDENT_AMBULATORY_CARE_PROVIDER_SITE_OTHER): Admitting: Audiology

## 2023-12-12 DIAGNOSIS — H9012 Conductive hearing loss, unilateral, left ear, with unrestricted hearing on the contralateral side: Secondary | ICD-10-CM | POA: Diagnosis not present

## 2023-12-12 NOTE — Progress Notes (Signed)
  [address] [place], [place] [address] [phone]  Audiological Evaluation    Name: Shawn Franco     DOB:   03/31/1970      MRN:   [id number]                                                                                     Service Date: 12/12/2023     Accompanied by: unaccompanied   Patient comes today after Dr. Karis, ENT sent a referral for a hearing evaluation due to concerns with left tympanic membrane perforation. The hearing evaluation was requested prior to tympanoplasty.   Symptoms Yes Details  Hearing loss  [x]  Left ear  Tinnitus  [x]  Left ear  Ear pain/ infections/pressure  []  Left eardrum perforation since he was a child  Balance problems  [x]  Floating sensation- a little  Noise exposure history  []    Previous ear surgeries  []    Family history of hearing loss  []    Amplification  []    Other  []      Otoscopy: Right ear: Clear external ear canal and notable landmarks visualized on the tympanic membrane. Left ear:  Abnormal eardrum appearance.  Tympanometry: Right ear: Normal external ear canal volume with normal middle ear pressure and tympanic membrane compliance (Type A). Findings are suggestive of normal middle ear function. Left ear: Large external ear canal volume with no middle ear pressure peak or tympanic membrane compliance (Type B). Findings are suggestive of the presence of a tympanic membrane perforation or a pressure equalization (PE) tube.  Hearing Evaluation The hearing test results were completed under headphones and results are deemed to be of good reliability. Test technique:  conventional    Pure tone Audiometry: Right ear- Normal hearing from 125 Hz-8000 Hz.   Left ear-  Moderate  to normal conductive hearing loss from 125 Hz - 8000 Hz.  Speech Audiometry: Right ear- Speech Reception Threshold (SRT) was obtained at 15 dBHL. Left ear-Speech Reception Threshold (SRT) was obtained at 35 dBHL.   Word Recognition  Score Tested using NU-6 (recorded) Right ear: 100% was obtained at a presentation level of 55 dBHL with contralateral masking which is deemed as  excellent. Left ear: 100% was obtained at a presentation level of 55 dBHL with contralateral masking which is deemed as  excellent.   Recommendations: Follow up with ENT as per MD. Repeat audiogram after medical care.   [name], AUD

## 2023-12-13 ENCOUNTER — Encounter (HOSPITAL_BASED_OUTPATIENT_CLINIC_OR_DEPARTMENT_OTHER): Payer: Self-pay | Admitting: Otolaryngology

## 2023-12-13 ENCOUNTER — Other Ambulatory Visit: Payer: Self-pay

## 2023-12-13 NOTE — Progress Notes (Signed)
   12/13/23 1256  PAT Phone Screen  Is the patient taking a GLP-1 receptor agonist? No  Do You Have Diabetes? No  Do You Have Hypertension? No  Have You Ever Been to the ER for Asthma? No  Have You Taken Oral Steroids in the Past 3 Months? No  Do you Take Phenteramine or any Other Diet Drugs? No  Recent  Lab Work, EKG, CXR? Yes  Where was this test performed? (S)  EKG 10/2023  Do you have a history of heart problems? No  Any Recent Hospitalizations? No  Height 5' 11 (1.803 m)  Weight 83.9 kg  Pat Appointment Scheduled No  Reason for No Appointment Not Needed

## 2023-12-17 ENCOUNTER — Ambulatory Visit: Payer: Self-pay | Admitting: Neurosurgery

## 2023-12-17 NOTE — Progress Notes (Signed)
 Addendum: 12/17/2023.  Results of cervical flexion-extension x-rays have come back.  They are below.  Given the listhesis as well as his presentation consistent with cervical radiculopathy will plan on ordering a noncontrast MRI of the cervical spine.  DG Cervical Spine Complete Result Date: 12/15/2023 CLINICAL DATA:  Cervical radiculopathy EXAM: CERVICAL SPINE - COMPLETE 4+ VIEW COMPARISON:  None Available. FINDINGS: There is no evidence of cervical spine fracture or prevertebral soft tissue swelling. Alignment is normal with extension and neutral positioning. With flexion there is 2 mm of anterolisthesis at C4-C5 and C7-T1. There is 2 mm of retrolisthesis at C6-C7. No other significant bone abnormalities are identified. IMPRESSION: 1. Mild anterolisthesis at C4-C5 and C7-T1 with flexion. 2. Mild retrolisthesis at C6-C7 with flexion. Electronically Signed   By: Greig Pique M.D.   On: 12/15/2023 20:20

## 2023-12-18 ENCOUNTER — Other Ambulatory Visit: Payer: Self-pay | Admitting: Physician Assistant

## 2023-12-18 DIAGNOSIS — M5416 Radiculopathy, lumbar region: Secondary | ICD-10-CM

## 2023-12-22 ENCOUNTER — Other Ambulatory Visit: Payer: Self-pay

## 2023-12-22 ENCOUNTER — Inpatient Hospital Stay
Admission: RE | Admit: 2023-12-22 | Discharge: 2023-12-22 | Attending: Physician Assistant | Admitting: Physician Assistant

## 2023-12-22 ENCOUNTER — Ambulatory Visit
Admission: RE | Admit: 2023-12-22 | Discharge: 2023-12-22 | Disposition: A | Discharge status: Home / Self Care | Source: Ambulatory Visit | Attending: Neurosurgery | Admitting: Neurosurgery

## 2023-12-22 ENCOUNTER — Other Ambulatory Visit (HOSPITAL_COMMUNITY): Payer: Self-pay

## 2023-12-22 ENCOUNTER — Other Ambulatory Visit

## 2023-12-22 DIAGNOSIS — M5010 Cervical disc disorder with radiculopathy, unspecified cervical region: Secondary | ICD-10-CM

## 2023-12-22 DIAGNOSIS — M4726 Other spondylosis with radiculopathy, lumbar region: Secondary | ICD-10-CM | POA: Diagnosis not present

## 2023-12-22 DIAGNOSIS — M47812 Spondylosis without myelopathy or radiculopathy, cervical region: Secondary | ICD-10-CM | POA: Diagnosis not present

## 2023-12-22 DIAGNOSIS — M48061 Spinal stenosis, lumbar region without neurogenic claudication: Secondary | ICD-10-CM | POA: Diagnosis not present

## 2023-12-22 DIAGNOSIS — M5416 Radiculopathy, lumbar region: Secondary | ICD-10-CM

## 2023-12-25 ENCOUNTER — Ambulatory Visit (HOSPITAL_BASED_OUTPATIENT_CLINIC_OR_DEPARTMENT_OTHER): Admitting: Anesthesiology

## 2023-12-25 ENCOUNTER — Encounter (HOSPITAL_BASED_OUTPATIENT_CLINIC_OR_DEPARTMENT_OTHER)
Admission: RE | Disposition: A | Discharge status: Home / Self Care | Payer: Self-pay | Source: Home / Self Care | Attending: Otolaryngology

## 2023-12-25 ENCOUNTER — Other Ambulatory Visit: Payer: Self-pay

## 2023-12-25 ENCOUNTER — Encounter: Payer: Self-pay | Admitting: Neurosurgery

## 2023-12-25 ENCOUNTER — Encounter (HOSPITAL_BASED_OUTPATIENT_CLINIC_OR_DEPARTMENT_OTHER): Payer: Self-pay | Admitting: Otolaryngology

## 2023-12-25 ENCOUNTER — Ambulatory Visit (HOSPITAL_BASED_OUTPATIENT_CLINIC_OR_DEPARTMENT_OTHER)
Admission: RE | Admit: 2023-12-25 | Discharge: 2023-12-25 | Disposition: A | Attending: Otolaryngology | Admitting: Otolaryngology

## 2023-12-25 DIAGNOSIS — H6692 Otitis media, unspecified, left ear: Secondary | ICD-10-CM | POA: Diagnosis not present

## 2023-12-25 DIAGNOSIS — H7292 Unspecified perforation of tympanic membrane, left ear: Secondary | ICD-10-CM | POA: Diagnosis not present

## 2023-12-25 DIAGNOSIS — H902 Conductive hearing loss, unspecified: Secondary | ICD-10-CM | POA: Diagnosis not present

## 2023-12-25 DIAGNOSIS — K219 Gastro-esophageal reflux disease without esophagitis: Secondary | ICD-10-CM | POA: Diagnosis not present

## 2023-12-25 DIAGNOSIS — Z79624 Long term (current) use of inhibitors of nucleotide synthesis: Secondary | ICD-10-CM | POA: Diagnosis not present

## 2023-12-25 DIAGNOSIS — Z79899 Other long term (current) drug therapy: Secondary | ICD-10-CM | POA: Diagnosis not present

## 2023-12-25 DIAGNOSIS — H9012 Conductive hearing loss, unilateral, left ear, with unrestricted hearing on the contralateral side: Secondary | ICD-10-CM | POA: Diagnosis not present

## 2023-12-25 HISTORY — PX: TYMPANOPLASTY WITH GRAFT: SHX6567

## 2023-12-25 SURGERY — TYMPANOPLASTY, USING GRAFT
Anesthesia: General | Site: Ear | Laterality: Left

## 2023-12-25 MED ORDER — EPHEDRINE SULFATE-NACL 50-0.9 MG/10ML-% IV SOSY
PREFILLED_SYRINGE | INTRAVENOUS | Status: DC | PRN
  Administered 2023-12-25: 10 mg via INTRAVENOUS

## 2023-12-25 MED ORDER — CIPROFLOXACIN-DEXAMETHASONE 0.3-0.1 % OT SUSP
OTIC | Status: DC | PRN
  Administered 2023-12-25: 4 [drp] via OTIC

## 2023-12-25 MED ORDER — GLYCOPYRROLATE 0.2 MG/ML IJ SOLN
INTRAMUSCULAR | Status: DC | PRN
  Administered 2023-12-25: .2 mg via INTRAVENOUS

## 2023-12-25 MED ORDER — GLYCOPYRROLATE PF 0.2 MG/ML IJ SOSY
PREFILLED_SYRINGE | INTRAMUSCULAR | Status: AC
  Filled 2023-12-25: qty 1

## 2023-12-25 MED ORDER — OXYCODONE HCL 5 MG/5ML PO SOLN: 5.0000 mg | Freq: Once | ORAL | Status: DC | PRN

## 2023-12-25 MED ORDER — FENTANYL CITRATE (PF) 100 MCG/2ML IJ SOLN
INTRAMUSCULAR | Status: AC
  Filled 2023-12-25: qty 2

## 2023-12-25 MED ORDER — LIDOCAINE-EPINEPHRINE 1 %-1:100000 IJ SOLN
INTRAMUSCULAR | Status: DC | PRN
  Administered 2023-12-25: 1 mL

## 2023-12-25 MED ORDER — EPHEDRINE 5 MG/ML INJ
INTRAVENOUS | Status: AC
  Filled 2023-12-25: qty 5

## 2023-12-25 MED ORDER — DROPERIDOL 2.5 MG/ML IJ SOLN: 0.6250 mg | Freq: Once | INTRAMUSCULAR | Status: DC | PRN

## 2023-12-25 MED ORDER — ONDANSETRON HCL 4 MG/2ML IJ SOLN
INTRAMUSCULAR | Status: AC
  Filled 2023-12-25: qty 2

## 2023-12-25 MED ORDER — ACETAMINOPHEN 500 MG PO TABS
ORAL_TABLET | ORAL | Status: AC
  Filled 2023-12-25: qty 2

## 2023-12-25 MED ORDER — ACETAMINOPHEN 500 MG PO TABS: 1000.0000 mg | ORAL_TABLET | Freq: Once | ORAL | Status: DC

## 2023-12-25 MED ORDER — LACTATED RINGERS IV SOLN: INTRAVENOUS | Status: DC

## 2023-12-25 MED ORDER — CEFAZOLIN SODIUM-DEXTROSE 2-3 GM-%(50ML) IV SOLR
INTRAVENOUS | Status: DC | PRN
  Administered 2023-12-25: 2 g via INTRAVENOUS

## 2023-12-25 MED ORDER — ONDANSETRON HCL 4 MG/2ML IJ SOLN
INTRAMUSCULAR | Status: DC | PRN
  Administered 2023-12-25: 4 mg via INTRAVENOUS

## 2023-12-25 MED ORDER — FENTANYL CITRATE (PF) 100 MCG/2ML IJ SOLN: 25.0000 ug | INTRAMUSCULAR | Status: DC | PRN

## 2023-12-25 MED ORDER — CEFAZOLIN SODIUM-DEXTROSE 2-4 GM/100ML-% IV SOLN
INTRAVENOUS | Status: AC
  Filled 2023-12-25: qty 100

## 2023-12-25 MED ORDER — PHENYLEPHRINE 80 MCG/ML (10ML) SYRINGE FOR IV PUSH (FOR BLOOD PRESSURE SUPPORT)
PREFILLED_SYRINGE | INTRAVENOUS | Status: DC | PRN
  Administered 2023-12-25 (×5): 80 ug via INTRAVENOUS

## 2023-12-25 MED ORDER — ACETAMINOPHEN 10 MG/ML IV SOLN: 1000.0000 mg | Freq: Once | INTRAVENOUS | Status: DC | PRN

## 2023-12-25 MED ORDER — OXYCODONE HCL 5 MG PO TABS: 5.0000 mg | ORAL_TABLET | Freq: Once | ORAL | Status: DC | PRN

## 2023-12-25 MED ORDER — DEXAMETHASONE SOD PHOSPHATE PF 10 MG/ML IJ SOLN
INTRAMUSCULAR | Status: DC | PRN
  Administered 2023-12-25: 55 mg via INTRAVENOUS

## 2023-12-25 MED ORDER — PROPOFOL 10 MG/ML IV BOLUS
INTRAVENOUS | Status: AC
  Filled 2023-12-25: qty 20

## 2023-12-25 MED ORDER — LIDOCAINE 2% (20 MG/ML) 5 ML SYRINGE
INTRAMUSCULAR | Status: DC | PRN
  Administered 2023-12-25: 60 mg via INTRAVENOUS

## 2023-12-25 MED ORDER — PHENYLEPHRINE 80 MCG/ML (10ML) SYRINGE FOR IV PUSH (FOR BLOOD PRESSURE SUPPORT)
PREFILLED_SYRINGE | INTRAVENOUS | Status: AC
  Filled 2023-12-25: qty 10

## 2023-12-25 MED ORDER — MIDAZOLAM HCL 2 MG/2ML IJ SOLN
INTRAMUSCULAR | Status: AC
  Filled 2023-12-25: qty 2

## 2023-12-25 MED ORDER — FENTANYL CITRATE (PF) 100 MCG/2ML IJ SOLN
INTRAMUSCULAR | Status: DC | PRN
  Administered 2023-12-25: 50 ug via INTRAVENOUS
  Administered 2023-12-25 (×2): 25 ug via INTRAVENOUS
  Administered 2023-12-25 (×2): 50 ug via INTRAVENOUS

## 2023-12-25 MED ORDER — DEXMEDETOMIDINE HCL IN NACL 80 MCG/20ML IV SOLN
INTRAVENOUS | Status: DC | PRN
  Administered 2023-12-25 (×2): 4 ug via INTRAVENOUS

## 2023-12-25 MED ORDER — MIDAZOLAM HCL (PF) 2 MG/2ML IJ SOLN
INTRAMUSCULAR | Status: DC | PRN
  Administered 2023-12-25: 2 mg via INTRAVENOUS

## 2023-12-25 MED ORDER — 0.9 % SODIUM CHLORIDE (POUR BTL) OPTIME
TOPICAL | Status: DC | PRN
  Administered 2023-12-25: 75 mL

## 2023-12-25 MED ORDER — PROPOFOL 10 MG/ML IV BOLUS
INTRAVENOUS | Status: DC | PRN
  Administered 2023-12-25: 200 ug via INTRAVENOUS
  Administered 2023-12-25 (×2): 50 ug via INTRAVENOUS

## 2023-12-25 SURGICAL SUPPLY — 38 items
BLADE CLIPPER SURG (BLADE) IMPLANT
BLADE NDL 3 SS STRL (BLADE) IMPLANT
CANISTER SUCT 1200ML W/VALVE (MISCELLANEOUS) ×1 IMPLANT
CORD BIPOLAR FORCEPS 12FT (ELECTRODE) IMPLANT
COTTONBALL LRG STERILE PKG (GAUZE/BANDAGES/DRESSINGS) ×1 IMPLANT
DERMABOND ADVANCED .7 DNX12 (GAUZE/BANDAGES/DRESSINGS) IMPLANT
DRAPE MICROSCOPE WILD 40.5X102 (DRAPES) ×1 IMPLANT
DRAPE SURG 17X23 STRL (DRAPES) ×1 IMPLANT
DRSG GLASSCOCK MASTOID ADT (GAUZE/BANDAGES/DRESSINGS) IMPLANT
DRSG GLASSCOCK MASTOID PED (GAUZE/BANDAGES/DRESSINGS) IMPLANT
ELECT COATED BLADE 2.86 ST (ELECTRODE) ×1 IMPLANT
ELECTRODE REM PT RTRN 9FT ADLT (ELECTROSURGICAL) ×1 IMPLANT
GAUZE SPONGE 4X4 12PLY STRL (GAUZE/BANDAGES/DRESSINGS) IMPLANT
GAUZE SPONGE 4X4 12PLY STRL LF (GAUZE/BANDAGES/DRESSINGS) IMPLANT
GLOVE BIO SURGEON STRL SZ7.5 (GLOVE) ×1 IMPLANT
GOWN STRL REUS W/ TWL LRG LVL3 (GOWN DISPOSABLE) ×2 IMPLANT
IV CATH AUTO 14GX1.75 SAFE ORG (IV SOLUTION) ×1 IMPLANT
IV NS 500ML BAXH (IV SOLUTION) IMPLANT
IV SET EXT 30 76VOL 4 MALE LL (IV SETS) ×1 IMPLANT
NDL HYPO 25X1 1.5 SAFETY (NEEDLE) ×1 IMPLANT
NDL SAFETY ECLIPSE 18X1.5 (NEEDLE) ×1 IMPLANT
PACK BASIN DAY SURGERY FS (CUSTOM PROCEDURE TRAY) ×1 IMPLANT
PACK ENT DAY SURGERY (CUSTOM PROCEDURE TRAY) ×1 IMPLANT
PENCIL SMOKE EVACUATOR (MISCELLANEOUS) ×1 IMPLANT
SLEEVE IRRIGATION ELITE 7 (MISCELLANEOUS) IMPLANT
SLEEVE SCD COMPRESS KNEE MED (STOCKING) IMPLANT
SOLN 0.9% NACL POUR BTL 1000ML (IV SOLUTION) ×1 IMPLANT
SPIKE FLUID TRANSFER (MISCELLANEOUS) ×1 IMPLANT
SPONGE SURGIFOAM ABS GEL 12-7 (HEMOSTASIS) ×1 IMPLANT
SUT VIC AB 3-0 SH 27X BRD (SUTURE) IMPLANT
SUT VIC AB 4-0 P-3 18XBRD (SUTURE) IMPLANT
SUT VIC AB 4-0 PS2 18 (SUTURE) IMPLANT
SYR 3ML 18GX1 1/2 (SYRINGE) ×1 IMPLANT
SYR 5ML LL (SYRINGE) IMPLANT
SYR BULB EAR ULCER 3OZ GRN STR (SYRINGE) IMPLANT
TOWEL GREEN STERILE FF (TOWEL DISPOSABLE) ×1 IMPLANT
TRAY DSU PREP LF (CUSTOM PROCEDURE TRAY) ×1 IMPLANT
TUBING IRRIGATION (MISCELLANEOUS) IMPLANT

## 2023-12-25 NOTE — Anesthesia Procedure Notes (Signed)
 Procedure Name: LMA Insertion Date/Time: 12/25/2023 10:07 AM  Performed by: Leotha Andrez DEL, CRNAPre-anesthesia Checklist: Patient identified, Emergency Drugs available, Suction available, Patient being monitored and Timeout performed Patient Re-evaluated:Patient Re-evaluated prior to induction Oxygen Delivery Method: Circle system utilized Preoxygenation: Pre-oxygenation with 100% oxygen Induction Type: IV induction Ventilation: Mask ventilation without difficulty LMA: LMA inserted LMA Size: 5.0 Number of attempts: 1 Placement Confirmation: breath sounds checked- equal and bilateral and positive ETCO2 Tube secured with: Tape Dental Injury: Teeth and Oropharynx as per pre-operative assessment

## 2023-12-25 NOTE — H&P (Signed)
 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   He has a long-standing history of otitis media since infancy, which initially led to a ruptured tympanic membrane in the left ear. Over the years, he has experienced recurrent infections in the left ear, characterized by purulent discharge. These infections have restricted his ability to participate in water activities due to the risk of exacerbating the condition.   In 2016, he underwent a tympanoplasty at [facility]. However, the follow-up was delayed due to the surgeon's vacation, and an attempt to remove post-surgical material resulted in reopening the perforation. Since then, he has continued to experience intermittent infections, with purulent discharge occurring at least twice in the past year.   He has been using ear drops prescribed by his primary care provider, which have been effective in managing the infections. He still experiences issues when water enters the ear.   He reports hearing loss in the left ear, with a previous hearing test indicating a 30-40% loss on that side. He compensates by increasing the volume of the television and has not used hearing aids. He denies current dizziness.           Past Medical History:  Diagnosis Date   Duodenal nodule 04/2019    Dr. Wilhelmenia   Gastroesophageal reflux disease without esophagitis 02/13/2017   H. pylori infection 04/2019   History of colon polyps 2021   Seasonal allergic rhinitis due to pollen 02/13/2017               Past Surgical History:  Procedure Laterality Date   BIOPSY   03/04/2021    Procedure: BIOPSY;  Surgeon: Wilhelmenia Aloha Raddle., MD;  Location: [facility] ENDOSCOPY;  Service: Gastroenterology;;   MARVIE CANALOPLASTY        ESOPHAGOGASTRODUODENOSCOPY (EGD) WITH PROPOFOL  N/A 03/04/2021    Procedure: ESOPHAGOGASTRODUODENOSCOPY (EGD) WITH PROPOFOL ;  Surgeon: Wilhelmenia Aloha Raddle., MD;  Location: [facility] ENDOSCOPY;  Service: Gastroenterology;  Laterality: N/A;   EUS N/A 03/04/2021    Procedure: UPPER ENDOSCOPIC ULTRASOUND (EUS) RADIAL;  Surgeon: Wilhelmenia Aloha Raddle., MD;  Location: [facility] ENDOSCOPY;  Service: Gastroenterology;  Laterality: N/A;   NOSE SURGERY                   Family History  Problem Relation Age of Onset   Diabetes Mother     Hypertension Mother     Colon cancer Neg Hx     Esophageal cancer Neg Hx     Stomach cancer Neg Hx     Prostate cancer Neg Hx     Colon polyps Neg Hx     Rectal cancer Neg Hx            Social History:  reports that he has never smoked. He has never used smokeless tobacco. He reports that he does not currently use alcohol. He reports that he does not use drugs.   Allergies:  Allergies       Allergies  Allergen Reactions   Procaine Anaphylaxis   Tetanus Toxoid-Containing Vaccines Anaphylaxis      Tetanus Serum               Prior to Admission medications   Medication Sig Start Date End Date Taking? Authorizing Provider  acetaminophen  (TYLENOL ) 500 MG tablet Take 500-1,000 mg  by mouth every 6 (six) hours as needed (for pain.).     Yes [provider]  cetirizine (ZYRTEC) 10 MG tablet Take 10 mg by mouth daily as needed.     Yes [provider]  fluticasone  (FLONASE ) 50 MCG/ACT nasal spray Place 2 sprays into both nostrils as needed. Patient taking differently: Place 2 sprays into both nostrils daily as needed for allergies. 11/19/19   Yes Job Lukes, PA  gabapentin  (NEURONTIN ) 100 MG capsule Take 1 capsule (100 mg total) by mouth 3 (three) times daily. 04/27/23   Yes Job Lukes, PA  methocarbamol  (ROBAXIN ) 750 MG tablet Take 1 tablet (750 mg total) by mouth every 8 (eight) hours as needed for muscle spasms. 08/21/23   Yes Job Lukes, PA  omeprazole  (PRILOSEC) 20 MG capsule Take 1 capsule (20 mg total) by mouth daily. 08/21/23   Yes Job Lukes, PA  predniSONE  (DELTASONE ) 20 MG tablet Take 1 tablet (20 mg total) by mouth daily as needed for back pain 08/21/23   Yes Job Lukes, PA  valACYclovir  (VALTREX ) 1000 MG tablet Take 2 tablets (2,000 mg total) by mouth 2 (two) times daily  for 1 day and may repeat with future outbreaks 08/21/23   Yes Job Lukes, PA  VITAMIN D PO Take 1 tablet by mouth daily at 6 (six) AM. Takes occasionally     Yes [provider]      Blood pressure 117/80, pulse 74, temperature 98.2 F (36.8 C), temperature source Oral, height 5' 11 (1.803 m), weight 202 lb (91.6 kg), SpO2 96%. Exam: General: Communicates without difficulty, well nourished, no acute distress. Head: Normocephalic, no evidence injury, no tenderness, facial buttresses intact without stepoff. Face/sinus: No tenderness to palpation and percussion. Facial movement is normal and symmetric. Eyes: PERRL, EOMI. No scleral icterus, conjunctivae clear. Neuro: CN II exam reveals vision grossly intact.  No nystagmus at any point of gaze. Ears: Auricles well formed without lesions.  Ear canals are intact without mass or lesion.  No erythema or edema is appreciated.  A 15% left tympanic membrane perforation is noted.  The right tympanic membrane is intact and mobile.  Nose: External evaluation reveals normal support and skin without lesions.  Dorsum is intact.  Anterior rhinoscopy reveals congested mucosa over anterior aspect of inferior turbinates and intact septum.  No purulence noted. Oral:  Oral cavity and oropharynx are intact, symmetric, without erythema or edema.  Mucosa is moist without lesions. Neck: Full range of motion without pain.  There is no significant lymphadenopathy.  No masses palpable.  Thyroid  bed within normal limits to palpation.  Parotid glands and submandibular glands equal bilaterally without mass.   Trachea is midline. Neuro:  CN 2-12 grossly intact.    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 The perforation is currently  approximately 15% of the eardrum, and the middle ear space is dry. - The treatment options are extensively discussed.  Options include continuing conservative observation versus surgical intervention with revision left tympanoplasty.  The risk, benefits, alternatives, and details of the procedure are extensively discussed.  Questions are invited and answered. - Avoid water exposure to the left ear, especially untreated water. - The patient would like to proceed with the procedure.

## 2023-12-25 NOTE — Anesthesia Preprocedure Evaluation (Addendum)
 Anesthesia Evaluation  Patient identified by MRN, date of birth, ID band Patient awake    Reviewed: Allergy & Precautions, NPO status , Patient's Chart, lab work & pertinent test results  Airway Mallampati: II  TM Distance: >3 FB Neck ROM: Full    Dental  (+) Teeth Intact, Dental Advisory Given   Pulmonary neg pulmonary ROS   breath sounds clear to auscultation       Cardiovascular negative cardio ROS  Rhythm:Regular Rate:Normal     Neuro/Psych  negative psych ROS   GI/Hepatic Neg liver ROS,GERD  Medicated,,  Endo/Other  negative endocrine ROS    Renal/GU negative Renal ROS     Musculoskeletal negative musculoskeletal ROS (+)    Abdominal   Peds  Hematology negative hematology ROS (+)   Anesthesia Other Findings   Reproductive/Obstetrics                              Anesthesia Physical Anesthesia Plan  ASA: 2  Anesthesia Plan: General   Post-op Pain Management: Tylenol  PO (pre-op)*   Induction: Intravenous  PONV Risk Score and Plan: 3 and Ondansetron , Dexamethasone  and Midazolam   Airway Management Planned: LMA  Additional Equipment: None  Intra-op Plan:   Post-operative Plan: Extubation in OR  Informed Consent: I have reviewed the patients History and Physical, chart, labs and discussed the procedure including the risks, benefits and alternatives for the proposed anesthesia with the patient or authorized representative who has indicated his/her understanding and acceptance.     Dental advisory given  Plan Discussed with: CRNA  Anesthesia Plan Comments:          Anesthesia Quick Evaluation

## 2023-12-25 NOTE — Transfer of Care (Signed)
 Immediate Anesthesia Transfer of Care Note  Patient: Shawn Franco  Procedure(s) Performed: TYMPANOPLASTY LEFT WITH RIGHT EAR TEMPORALIS FASCIA GRAFT (Left: Ear)  Patient Location: PACU  Anesthesia Type:General  Level of Consciousness: awake  Airway & Oxygen Therapy: Patient Spontanous Breathing and Patient connected to face mask oxygen  Post-op Assessment: Report given to RN and Post -op Vital signs reviewed and stable  Post vital signs: Reviewed and stable  Last Vitals:  Vitals Value Taken Time  BP 116/69 12/25/23 11:30  Temp 36.7 C 12/25/23 11:30  Pulse 74 12/25/23 11:30  Resp 12 12/25/23 11:30  SpO2 99 % 12/25/23 11:30    Last Pain:  Vitals:   12/25/23 0855  TempSrc: Temporal  PainSc: 0-No pain         Complications: No notable events documented.

## 2023-12-25 NOTE — Telephone Encounter (Signed)
 Called Radiology room and asked for MRIs to be read

## 2023-12-25 NOTE — Anesthesia Postprocedure Evaluation (Signed)
 Anesthesia Post Note  Patient: Shawn Franco  Procedure(s) Performed: TYMPANOPLASTY LEFT WITH RIGHT EAR TEMPORALIS FASCIA GRAFT (Left: Ear)     Patient location during evaluation: PACU Anesthesia Type: General Level of consciousness: awake and alert Pain management: pain level controlled Vital Signs Assessment: post-procedure vital signs reviewed and stable Respiratory status: spontaneous breathing, nonlabored ventilation, respiratory function stable and patient connected to nasal cannula oxygen Cardiovascular status: blood pressure returned to baseline and stable Postop Assessment: no apparent nausea or vomiting Anesthetic complications: no   No notable events documented.  Last Vitals:  Vitals:   12/25/23 1210 12/25/23 1232  BP: 127/78 125/88  Pulse: 84 77  Resp: 13 16  Temp:  (!) 36.3 C  SpO2: 96% 95%    Last Pain:  Vitals:   12/25/23 1232  TempSrc:   PainSc: 0-No pain                 [name]

## 2023-12-25 NOTE — Discharge Instructions (Addendum)
 Apply Ciprodex  eardrops 4 drops left ear twice daily for 10 days.   Post Anesthesia Home Care Instructions  Activity: Get plenty of rest for the remainder of the day. A responsible individual must stay with you for 24 hours following the procedure.  For the next 24 hours, DO NOT: -Drive a car -Advertising copywriter -Drink alcoholic beverages -Take any medication unless instructed by your physician -Make any legal decisions or sign important papers.  Meals: Start with liquid foods such as gelatin or soup. Progress to regular foods as tolerated. Avoid greasy, spicy, heavy foods. If nausea and/or vomiting occur, drink only clear liquids until the nausea and/or vomiting subsides. Call your physician if vomiting continues.  Special Instructions/Symptoms: Your throat may feel dry or sore from the anesthesia or the breathing tube placed in your throat during surgery. If this causes discomfort, gargle with warm salt water. The discomfort should disappear within 24 hours.  If you had a scopolamine patch placed behind your ear for the management of post- operative nausea and/or vomiting:  1. The medication in the patch is effective for 72 hours, after which it should be removed.  Wrap patch in a tissue and discard in the trash. Wash hands thoroughly with soap and water. 2. You may remove the patch earlier than 72 hours if you experience unpleasant side effects which may include dry mouth, dizziness or visual disturbances. 3. Avoid touching the patch. Wash your hands with soap and water after contact with the patch.

## 2023-12-25 NOTE — Op Note (Signed)
 DATE OF PROCEDURE: 12/25/2023  OPERATIVE REPORT    SURGEON:  Daniel Moccasin, MD   PREOPERATIVE DIAGNOSIS:  1.  Left tympanic membrane perforation. 2.  Left ear conductive hearing loss   POSTOPERATIVE DIAGNOSIS:   1.  Left tympanic membrane perforation. 2.  Left ear conductive hearing loss   PROCEDURES PERFORMED: 1.  Left transcanal tympanoplasty. 2.  Right postauricular temporalis fascia graft harvesting.   ANESTHESIA:  General laryngeal mask anesthesia.   COMPLICATIONS:  None.   ESTIMATED BLOOD LOSS:  Minimal.   INDICATION FOR PROCEDURE:   Shawn Franco is a 53 y.o. 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 Over the years, he has experienced recurrent infections in the left ear, characterized by purulent discharge. These infections have restricted his ability to participate in water activities due to the risk of exacerbating the condition. In 2016, he underwent an unsuccessful tympanoplasty at [facility]. Since then, he has continued to experience intermittent infections. In addition, he also has left ear conductive hearing loss. Based on the above findings, the decision was made for the patient to undergo the above-stated procedures.  The risks, benefits, alternatives, and details of the procedures were discussed with the patient.  Questions were invited and answered.  Informed consent was obtained.   DESCRIPTION OF PROCEDURE:  The patient was taken to the operating room and placed supine on the operating table.  General laryngeal mask anesthesia was induced by the anesthesiologist.  Under the operating microscope, the left ear canal was cleaned of all cerumen.  A rim of fibrotic tissue was removed circumferentially from the perforation.  A standard tympanomeatal flap was elevated in a standard fashion.  Significant bone growth was noted within the middle ear space.  A portion of the tympanic membrane was  noted to be tightly adherent to the promontory.   Attention was then focused on obtaining the temporalis fascia graft.  The decision was made to obtain the temporalis fascia graft on the right side, secondary to his previous left tympanoplasty procedure.  A separate right postauricular incision was made.  Incision was carried down to the level of the temporalis fascia.  A 2 x 2 cm temporalis fascia graft was harvested in a standard fashion.  Hemostasis was achieved with Bovie electrocautery.  The surgical site was copiously irrigated.  The incision was closed in layers with 4-0 Vicryl and Dermabond.   Under the operating microscope, the harvested graft was inserted via the ear canal into the middle ear space.  It was used to cover the TM perforation.  Gelfoam was used to pack the middle ear and ear canal, sandwiching the neotympanum.  Ciprodex  ear drops were applied. Antibiotic ointment was applied to the ear canal.  That concluded the procedure for the patient.  The care of the patient was turned over to the anesthesiologist.  The patient was awakened from anesthesia without difficulty.  He was extubated and transferred to the recovery room in good condition.   OPERATIVE FINDINGS: A 20% left TM perforation was noted.  Significant bone growth was noted within the left middle ear space.  A portion of the left tympanic membrane was noted to be tightly adherent to the promontory.   SPECIMEN:  None.   FOLLOWUP CARE:  The patient will be discharged home once he is awake and alert.  He will follow up in my office in 1 week.

## 2023-12-26 ENCOUNTER — Other Ambulatory Visit (HOSPITAL_COMMUNITY): Payer: Self-pay

## 2023-12-26 ENCOUNTER — Ambulatory Visit: Payer: Self-pay | Admitting: Neurosurgery

## 2023-12-26 ENCOUNTER — Encounter (HOSPITAL_BASED_OUTPATIENT_CLINIC_OR_DEPARTMENT_OTHER): Payer: Self-pay | Admitting: Otolaryngology

## 2023-12-26 ENCOUNTER — Encounter: Payer: Self-pay | Admitting: Neurosurgery

## 2023-12-26 ENCOUNTER — Telehealth (INDEPENDENT_AMBULATORY_CARE_PROVIDER_SITE_OTHER): Payer: Self-pay

## 2023-12-26 NOTE — Telephone Encounter (Signed)
 Patient called asking where his ear drops are Dr. Karis prescribed him after surgery yesterday. And if they could get sent in today.

## 2023-12-27 ENCOUNTER — Other Ambulatory Visit (HOSPITAL_COMMUNITY): Payer: Self-pay

## 2023-12-27 ENCOUNTER — Other Ambulatory Visit: Payer: Self-pay | Admitting: Physician Assistant

## 2023-12-27 ENCOUNTER — Telehealth (INDEPENDENT_AMBULATORY_CARE_PROVIDER_SITE_OTHER): Payer: Self-pay | Admitting: Otolaryngology

## 2023-12-27 ENCOUNTER — Telehealth (INDEPENDENT_AMBULATORY_CARE_PROVIDER_SITE_OTHER): Payer: Self-pay

## 2023-12-27 ENCOUNTER — Encounter: Payer: Self-pay | Admitting: Audiology

## 2023-12-27 DIAGNOSIS — M5416 Radiculopathy, lumbar region: Secondary | ICD-10-CM

## 2023-12-27 DIAGNOSIS — M5010 Cervical disc disorder with radiculopathy, unspecified cervical region: Secondary | ICD-10-CM

## 2023-12-27 MED ORDER — CIPROFLOXACIN-DEXAMETHASONE 0.3-0.1 % OT SUSP
4.0000 [drp] | Freq: Two times a day (BID) | OTIC | 99 refills | Status: AC
  Filled 2023-12-27: qty 7.5, 19d supply, fill #0

## 2023-12-27 NOTE — Progress Notes (Signed)
 Referral made for cervical and lumbar ESI

## 2023-12-27 NOTE — Telephone Encounter (Signed)
 Ciprodex  Otic Suspension was called in at [facility] outpatient pharmacy with prn refill for one year.

## 2023-12-27 NOTE — Telephone Encounter (Signed)
 Patient left voice message today at 10:25 am. He stated that he saw Karis the other day. He stated that Karis was to send in ear drops for him. His pharmacy has not received it.

## 2023-12-27 NOTE — Telephone Encounter (Signed)
 The patient called in requesting to move his follow up appointment back to 01/05/24 with Chyrl Cohen.  He is aware Dr. Karis will be out of the office, but he is not available on the 15 at all.  Dr Karis approved moving his appointment to t2

## 2023-12-28 ENCOUNTER — Other Ambulatory Visit: Payer: Self-pay | Admitting: Physician Assistant

## 2023-12-28 ENCOUNTER — Encounter: Payer: Self-pay | Admitting: Family Medicine

## 2023-12-28 NOTE — Telephone Encounter (Signed)
 Go to my favorites under orders, look to right and it says follow users.   Type in my name and follow.   Then you can see my favorite orders. It's under lumbar ESI with  imaging.   If that doesn't work, can have Kendelyn put in order for you to cosign.

## 2023-12-29 ENCOUNTER — Other Ambulatory Visit: Payer: Self-pay | Admitting: Physician Assistant

## 2023-12-29 DIAGNOSIS — M5416 Radiculopathy, lumbar region: Secondary | ICD-10-CM

## 2023-12-29 DIAGNOSIS — M5010 Cervical disc disorder with radiculopathy, unspecified cervical region: Secondary | ICD-10-CM

## 2023-12-29 NOTE — Telephone Encounter (Signed)
 Please reach out to pt to assist with scheduling.

## 2024-01-01 ENCOUNTER — Encounter (INDEPENDENT_AMBULATORY_CARE_PROVIDER_SITE_OTHER): Admitting: Otolaryngology

## 2024-01-05 ENCOUNTER — Encounter (INDEPENDENT_AMBULATORY_CARE_PROVIDER_SITE_OTHER): Admitting: Physician Assistant

## 2024-01-05 ENCOUNTER — Encounter (INDEPENDENT_AMBULATORY_CARE_PROVIDER_SITE_OTHER): Payer: Self-pay | Admitting: Physician Assistant

## 2024-01-05 ENCOUNTER — Ambulatory Visit (INDEPENDENT_AMBULATORY_CARE_PROVIDER_SITE_OTHER): Admitting: Physician Assistant

## 2024-01-05 ENCOUNTER — Other Ambulatory Visit: Payer: Self-pay | Admitting: Physician Assistant

## 2024-01-05 VITALS — BP 123/78 | HR 81

## 2024-01-05 DIAGNOSIS — H7292 Unspecified perforation of tympanic membrane, left ear: Secondary | ICD-10-CM

## 2024-01-05 DIAGNOSIS — M5010 Cervical disc disorder with radiculopathy, unspecified cervical region: Secondary | ICD-10-CM

## 2024-01-05 DIAGNOSIS — Z9889 Other specified postprocedural states: Secondary | ICD-10-CM

## 2024-01-05 DIAGNOSIS — M5416 Radiculopathy, lumbar region: Secondary | ICD-10-CM

## 2024-01-05 NOTE — Progress Notes (Unsigned)
"       ° °  LILLETTE Ileana Collet, PhD, LAT, ATC acting as a scribe for Artist Lloyd, MD.  Shawn Franco is a 52 y.o. male who presents to [facility] today for cont'd LBP. Pt was last seen by Dr. Lloyd on 10/16/23 and an ESI was ordered.  My sent a MyChart message on 10/21 reporting worsening s/s and a referral was placed to neurosurgery.  Today, pt reports ***  Dx imaging: 12/22/23 L-spine & c-spine MRI  12/11/23 c-spine XR  11/07/23 L-spine XR 03/20/23 L-spine & T-spine MRI   Pertinent review of systems: ***  Relevant historical information: ***   Exam:  There were no vitals taken for this visit. General: Well Developed, well nourished, and in no acute distress.   MSK: ***    Lab and Radiology Results No results found for this or any previous visit (from the past 72 hours). No results found.     Assessment and Plan: 53 y.o. male with ***   PDMP not reviewed this encounter. No orders of the defined types were placed in this encounter.  No orders of the defined types were placed in this encounter.    Discussed warning signs or symptoms. Please see discharge instructions. Patient expresses understanding.   ***  "

## 2024-01-05 NOTE — Progress Notes (Unsigned)
 3 months audio    Couple days ago yawned and felt air in ear  15 percent   Some ringing come and going, heairng at baseline no draingage

## 2024-01-08 ENCOUNTER — Encounter: Payer: Self-pay | Admitting: Family Medicine

## 2024-01-08 ENCOUNTER — Ambulatory Visit (INDEPENDENT_AMBULATORY_CARE_PROVIDER_SITE_OTHER): Admitting: Family Medicine

## 2024-01-08 VITALS — Ht 71.0 in

## 2024-01-08 DIAGNOSIS — M5416 Radiculopathy, lumbar region: Secondary | ICD-10-CM

## 2024-01-08 NOTE — Patient Instructions (Addendum)
 Thank you for coming in today.   Proceed with ESI as already scheduled.   Let us  know how it goes.   See you back as needed.

## 2024-01-08 NOTE — Progress Notes (Signed)
 Dear Dr. Job, Here is my assessment for our mutual patient, Shawn Franco. Thank you for allowing me the opportunity to care for your patient. Please do not hesitate to contact me should you have any other questions. Sincerely, Chyrl Cohen PA-C  Otolaryngology Clinic Note Referring provider: Dr. Job HPI:  Shawn Franco is a 53 y.o. male kindly referred by Dr. Job    53 year old gentleman seen in our office for follow-up evaluation status post left transcanal tympanoplasty and right postauricular temporalis fascia graft harvesting by Dr. Karis on 12/25/2023.  Below is a recap of his last encounter.  Shawn Franco is a 53 y.o. 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 Over the years, he has experienced recurrent infections in the left ear, characterized by purulent discharge. These infections have restricted his ability to participate in water activities due to the risk of exacerbating the condition. In 2016, he underwent an unsuccessful tympanoplasty at [facility]. Since then, he has continued to experience intermittent infections. In addition, he also has left ear conductive hearing loss.    The patient notes that after the surgery he had done well, he notes 1 week ago he developed a sensation of air in his left ear as he had prior to the repair.  He denies any drainage, denies any significant changes to his hearing.  He denies any fever.  He has been using his Ciprodex  as directed.   Independent Review of Additional Tests or Records:   Op note 12/25/2023   PMH/Meds/All/SocHx/FamHx/ROS:   Past Medical History:  Diagnosis Date   Duodenal nodule 04/2019   Dr. Wilhelmenia   Gastroesophageal reflux disease without esophagitis 02/13/2017   H. pylori infection 04/2019   History of colon polyps 2021   Seasonal allergic rhinitis due to pollen 02/13/2017     Past Surgical History:   Procedure Laterality Date   BIOPSY  03/04/2021   Procedure: BIOPSY;  Surgeon: Wilhelmenia Aloha Raddle., MD;  Location: [facility] ENDOSCOPY;  Service: Gastroenterology;;   Shawn CANALOPLASTY     ESOPHAGOGASTRODUODENOSCOPY (EGD) WITH PROPOFOL  N/A 03/04/2021   Procedure: ESOPHAGOGASTRODUODENOSCOPY (EGD) WITH PROPOFOL ;  Surgeon: Wilhelmenia Aloha Raddle., MD;  Location: [facility] ENDOSCOPY;  Service: Gastroenterology;  Laterality: N/A;   EUS N/A 03/04/2021   Procedure: UPPER ENDOSCOPIC ULTRASOUND (EUS) RADIAL;  Surgeon: Wilhelmenia Aloha Raddle., MD;  Location: [facility] ENDOSCOPY;  Service: Gastroenterology;  Laterality: N/A;   LUMBAR LAMINECTOMY/DECOMPRESSION MICRODISCECTOMY Left 11/07/2023   Procedure: Left Side L4-5 Microdiscectomy, MIS;  Surgeon: Claudene Penne ORN, MD;  Location: [facility] ORS;  Service: Neurosurgery;  Laterality: Left;   NOSE SURGERY     TYMPANOPLASTY WITH GRAFT Left 12/25/2023   Procedure: TYMPANOPLASTY LEFT WITH RIGHT EAR TEMPORALIS FASCIA GRAFT;  Surgeon: Karis Clunes, MD;  Location: [facility];  Service: ENT;  Laterality: Left;    Family History  Problem Relation Age of Onset   Diabetes Mother    Hypertension Mother    Colon cancer Neg Hx    Esophageal cancer Neg Hx    Stomach cancer Neg Hx    Prostate cancer Neg Hx    Colon polyps Neg Hx    Rectal cancer Neg Hx      Social Connections: Not on file     Current Medications[1]   Physical Exam:   BP 123/78   Pulse 81   SpO2 97%   Pertinent Findings  CN II-XII grossly intact Minimal debris in the  left external auditory canal, approximately 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  Seprately Identifiable Procedures:  None  Impression & Plans:  Shawn Franco is a 53 y.o. male with the following    Status post left tympanoplasty-  Doing well today, he does have persistent perforation along the left TM.  Plan for follow-up evaluation  with Dr. Karis on 03/06/2023.  He develops any new or worsening signs or symptoms he will reach out to our office.  - f/u follow-up with Dr. Karis with repeat audiogram.   Thank you for allowing me the opportunity to care for your patient. Please do not hesitate to contact me should you have any other questions.  Sincerely, Chyrl Cohen PA-C  [facility] Phone: [phone] Fax: [phone]  01/08/2024, 10:08 AM          [1]  Current Outpatient Medications:    acetaminophen  (TYLENOL ) 500 MG tablet, Take 500-1,000 mg by mouth every 6 (six) hours as needed (for pain.)., Disp: , Rfl:    cetirizine (ZYRTEC) 10 MG tablet, Take 10 mg by mouth daily as needed., Disp: , Rfl:    ciprofloxacin -dexamethasone  (CIPRODEX ) OTIC suspension, Place 4 drops into the left ear 2 (two) times daily for 7 days., Disp: 7.5 mL, Rfl: 99   fluticasone  (FLONASE ) 50 MCG/ACT nasal spray, Place 2 sprays into both nostrils as needed., Disp: 16 g, Rfl: 4   gabapentin  (NEURONTIN ) 100 MG capsule, Take 1 capsule (100 mg total) by mouth 3 (three) times daily., Disp: 90 capsule, Rfl: 3   methocarbamol  (ROBAXIN ) 750 MG tablet, Take 1 tablet (750 mg total) by mouth every 8 (eight) hours as needed for muscle spasms., Disp: 90 tablet, Rfl: 3   omeprazole  (PRILOSEC) 20 MG capsule, Take 1 capsule (20 mg total) by mouth daily., Disp: 90 capsule, Rfl: 3   valACYclovir  (VALTREX ) 1000 MG tablet, Take 2 tablets (2,000 mg total) by mouth 2 (two) times daily  for 1 day and may repeat with future outbreaks, Disp: 30 tablet, Rfl: 2   VITAMIN D PO, Take 1 tablet by mouth daily at 6 (six) AM. Takes occasionally, Disp: , Rfl:

## 2024-01-09 ENCOUNTER — Other Ambulatory Visit (HOSPITAL_COMMUNITY): Payer: Self-pay

## 2024-01-16 NOTE — Discharge Instructions (Signed)

## 2024-01-17 ENCOUNTER — Other Ambulatory Visit: Payer: Self-pay | Admitting: Physician Assistant

## 2024-01-17 ENCOUNTER — Inpatient Hospital Stay
Admission: RE | Admit: 2024-01-17 | Discharge: 2024-01-17 | Disposition: A | Source: Ambulatory Visit | Attending: Physician Assistant | Admitting: Physician Assistant

## 2024-01-17 DIAGNOSIS — M5010 Cervical disc disorder with radiculopathy, unspecified cervical region: Secondary | ICD-10-CM

## 2024-01-17 DIAGNOSIS — M5416 Radiculopathy, lumbar region: Secondary | ICD-10-CM

## 2024-01-17 MED ORDER — IOPAMIDOL (ISOVUE-M 200) INJECTION 41%
1.0000 mL | Freq: Once | INTRAMUSCULAR | Status: AC
  Administered 2024-01-17: 1 mL via EPIDURAL

## 2024-01-17 MED ORDER — METHYLPREDNISOLONE ACETATE 40 MG/ML INJ SUSP (RADIOLOG
80.0000 mg | Freq: Once | INTRAMUSCULAR | Status: AC
  Administered 2024-01-17: 80 mg via EPIDURAL

## 2024-01-18 ENCOUNTER — Encounter: Payer: Self-pay | Admitting: Family Medicine

## 2024-01-19 ENCOUNTER — Other Ambulatory Visit (HOSPITAL_COMMUNITY): Payer: Self-pay

## 2024-01-19 ENCOUNTER — Other Ambulatory Visit: Payer: Self-pay | Admitting: Physician Assistant

## 2024-01-19 MED ORDER — CELECOXIB 200 MG PO CAPS
200.0000 mg | ORAL_CAPSULE | Freq: Every day | ORAL | 2 refills | Status: DC
  Filled 2024-01-19: qty 30, 30d supply, fill #0
  Filled 2024-02-20: qty 30, 30d supply, fill #1

## 2024-01-19 NOTE — Telephone Encounter (Signed)
 Dr. Joane, please see message from pt and send in new rx if appropriate. Rx pending.

## 2024-01-22 ENCOUNTER — Other Ambulatory Visit (HOSPITAL_COMMUNITY): Payer: Self-pay

## 2024-01-22 MED ORDER — METHOCARBAMOL 750 MG PO TABS
750.0000 mg | ORAL_TABLET | Freq: Three times a day (TID) | ORAL | 3 refills | Status: AC | PRN
  Filled 2024-01-22: qty 90, 30d supply, fill #0
  Filled 2024-02-20: qty 90, 30d supply, fill #1

## 2024-01-23 ENCOUNTER — Encounter: Payer: Self-pay | Admitting: Physician Assistant

## 2024-01-23 ENCOUNTER — Other Ambulatory Visit (HOSPITAL_COMMUNITY): Payer: Self-pay

## 2024-01-23 ENCOUNTER — Ambulatory Visit: Admitting: Physician Assistant

## 2024-01-23 VITALS — BP 132/84 | Temp 98.1°F | Ht 71.0 in | Wt 195.0 lb

## 2024-01-23 DIAGNOSIS — Z09 Encounter for follow-up examination after completed treatment for conditions other than malignant neoplasm: Secondary | ICD-10-CM

## 2024-01-23 DIAGNOSIS — M5416 Radiculopathy, lumbar region: Secondary | ICD-10-CM

## 2024-01-23 MED ORDER — PREGABALIN 25 MG PO CAPS
25.0000 mg | ORAL_CAPSULE | Freq: Every day | ORAL | 1 refills | Status: AC
  Filled 2024-01-23: qty 30, 30d supply, fill #0

## 2024-01-23 NOTE — Progress Notes (Signed)
 "  REFERRING PHYSICIAN:  Job Lukes, Pa [address] [place],  [place] [address]  DOS: 11/07/23, Left sided L4-5 microdiscectomy  HISTORY OF PRESENT ILLNESS: Shawn Franco is approximately 11 weeks status post Left sided L4-5 microdiscectomy. Patient unfortunately had a recurrence of pain on 11/26 that resulted in a repeat MRI and recent left-sided L4-5 ESI on 01/17/24.  He does feel as though this helped with his left lower extremity, but notes that prior to surgery he was having no right sided symptoms and now his right leg is worse than the left.  He continues to have left-sided foot weakness.  No new saddle anesthesia or incontinence.   PHYSICAL EXAMINATION:  General: Patient is well developed, well nourished, calm, collected, and in no apparent distress.   NEUROLOGICAL:  General: In no acute distress.   Awake, alert, oriented to person, place, and time.  Pupils equal round and reactive to light.  Facial tone is symmetric.     Strength:               Side Iliopsoas Quads Hamstring PF DF EHL  R 5 5 5 5 5 5   L 5 4 5 5 3  4+   Incision c/d/I Absent Achilles reflexes bilaterally.  1+ bilateral patella.   ROS (Neurologic):  Negative except as noted above  IMAGING: MR Lumbar spine 12/22/23:  ADDENDUM: As noted in the original impression, the previously noted L4-5 disc protrusion is decreased in size compared to 11/06/23. Patient is status post L4-5 microdiscectomy. There is a residual/recurrent disc protrusion at L4-5 which results in mild left lateral recess narrowing. Overall, the degree of lateral recess narrowing and spinal canal stenosis at L4-5 is improved compared to 11/06/23. There is interval resolution of mass effect on the traversing nerve roots at this level.   There are residual areas of extruded disc material along the posterior aspect of L4 as seen on series 303 image 10. This is decreased compared to prior.   There is increased foraminal  narrowing on the left at L4-5 which is worsened since the 11/06/23 study. This may be secondary to migration of extruded disc material contributing to narrowing at the medial aspect of the left neural foramen as seen on series 305 images 25 and 26.   ----------------------------------------------------   Electronically signed by: Donnice Mania MD 12/26/2023 02:33 PM EST RP Workstation: [id number]    Addended by Mania Donnice ORN, MD on 12/26/2023  2:33 PM    Study Result  Narrative & Impression   EXAM: MRI LUMBAR SPINE 12/22/2023 03:19:49 PM   TECHNIQUE: Multiplanar multisequence MRI of the lumbar spine was performed without the administration of intravenous contrast.   COMPARISON: MR Lumbar spine without contrast 11/06/2023.   CLINICAL HISTORY: Low back pain, prior surgery, new symptoms, lumbar radiculopathy.   FINDINGS:   BONES AND ALIGNMENT: Normal alignment. Normal vertebral body heights. Similar chronic endplate irregularity and signal abnormality at the L4-L5 endplates. No significant bone marrow edema. No evidence of acute fracture.   SPINAL CORD: The conus medullaris extends to the T12 level.   SOFT TISSUES: Mild edema within the left-sided paraspinal musculature from L4 to the sacrum, which is new from prior and may reflect muscle strain.   T12-L1: No significant spinal canal or foraminal stenosis.   L1-L2: No significant disc herniation. No spinal canal stenosis or neural foraminal narrowing.   L2-L3: No significant disc herniation. No spinal canal stenosis or neural foraminal narrowing.   L3-L4: No significant disc herniation.  No spinal canal stenosis or neural foraminal narrowing.   L4-L5: Disc desiccation and mild disc height loss. Similar appearance of left paracentral/subarticular disc protrusion resulting in lateral recess narrowing. The disc protrusion is decreased slightly in size compared to the prior study. Signal abnormality in the disc  at this level likely related to prior postsurgical changes. There is lateral recess left slightly greater than right which appears slightly improved since the prior MRI. There is mild to moderate facet arthrosis. Mild spinal canal stenosis appears slightly improved. There is increased neural foraminal stenosis on the left with disc protrusion possibly abutting the exiting left L4 nerve root.   L5-S1: No significant disc herniation. No spinal canal stenosis or neural foraminal narrowing.        ASSESSMENT/PLAN:  Shawn Franco is  is approximately 11 weeks status post Left sided L4-5 microdiscectomy. Patient unfortunately had a recurrence of pain on 11/26 with radiation of pain from his back into bilateral lower extremities in the L4-5 distribution.  He did undergo a left-sided L4-5 ESI approximately 1 week ago with some relief.  Now however he feels as though his right lower extremity pain is more prominent.  We discussed ESI on the contralateral side I am happy to put in an order for this.  In addition patient was having some side effects with gabapentin  we will trial Lyrica  at night for him.  Plan for him to come back in approximately 4 weeks to see Dr. Claudene.   Advised to contact the office if any questions or concerns arise.  Lyle Decamp PA-C Department of neurosurgery    "

## 2024-01-30 NOTE — Discharge Instructions (Signed)

## 2024-02-01 ENCOUNTER — Inpatient Hospital Stay
Admission: RE | Admit: 2024-02-01 | Discharge: 2024-02-01 | Attending: Physician Assistant | Admitting: Physician Assistant

## 2024-02-01 DIAGNOSIS — M5416 Radiculopathy, lumbar region: Secondary | ICD-10-CM

## 2024-02-01 MED ORDER — METHYLPREDNISOLONE ACETATE 40 MG/ML INJ SUSP (RADIOLOG
80.0000 mg | Freq: Once | INTRAMUSCULAR | Status: AC
  Administered 2024-02-01: 80 mg via EPIDURAL

## 2024-02-01 MED ORDER — IOPAMIDOL (ISOVUE-M 200) INJECTION 41%
1.0000 mL | Freq: Once | INTRAMUSCULAR | Status: AC
  Administered 2024-02-01: 1 mL via EPIDURAL

## 2024-02-02 ENCOUNTER — Other Ambulatory Visit

## 2024-02-07 NOTE — Progress Notes (Unsigned)
 Had L4-5 LESI done 02/01/2024

## 2024-02-19 ENCOUNTER — Ambulatory Visit: Admitting: Neurosurgery

## 2024-02-20 ENCOUNTER — Other Ambulatory Visit: Payer: Self-pay | Admitting: Family Medicine

## 2024-02-20 ENCOUNTER — Other Ambulatory Visit: Payer: Self-pay

## 2024-02-20 MED ORDER — CELECOXIB 200 MG PO CAPS
200.0000 mg | ORAL_CAPSULE | Freq: Every day | ORAL | 2 refills | Status: AC
  Filled 2024-02-20: qty 90, 90d supply, fill #0

## 2024-02-21 ENCOUNTER — Other Ambulatory Visit: Payer: Self-pay

## 2024-02-21 ENCOUNTER — Encounter: Payer: Self-pay | Admitting: Pharmacist

## 2024-03-05 ENCOUNTER — Ambulatory Visit (INDEPENDENT_AMBULATORY_CARE_PROVIDER_SITE_OTHER): Admitting: Otolaryngology

## 2024-03-05 ENCOUNTER — Ambulatory Visit (INDEPENDENT_AMBULATORY_CARE_PROVIDER_SITE_OTHER): Admitting: Audiology

## 2024-03-29 ENCOUNTER — Ambulatory Visit (INDEPENDENT_AMBULATORY_CARE_PROVIDER_SITE_OTHER): Admitting: Audiology

## 2024-03-29 ENCOUNTER — Ambulatory Visit (INDEPENDENT_AMBULATORY_CARE_PROVIDER_SITE_OTHER): Admitting: Otolaryngology
